# Patient Record
Sex: Male | Born: 1980 | Race: Black or African American | Hispanic: No | Marital: Married | Smoking: Current some day smoker
Health system: Southern US, Community
[De-identification: ages and names within clinical notes are randomized; demographics above are authoritative.]

## PROBLEM LIST (undated history)

## (undated) ENCOUNTER — Emergency Department (HOSPITAL_COMMUNITY): Discharge status: Against Medical Advice

## (undated) DIAGNOSIS — T7840XA Allergy, unspecified, initial encounter: Secondary | ICD-10-CM

## (undated) DIAGNOSIS — R51 Headache: Secondary | ICD-10-CM

## (undated) DIAGNOSIS — R519 Headache, unspecified: Secondary | ICD-10-CM

## (undated) HISTORY — DX: Headache: R51

## (undated) HISTORY — PX: WISDOM TOOTH EXTRACTION: SHX21

## (undated) HISTORY — DX: Allergy, unspecified, initial encounter: T78.40XA

## (undated) HISTORY — PX: ROTATOR CUFF REPAIR: SHX139

## (undated) HISTORY — DX: Headache, unspecified: R51.9

---

## 2011-06-16 ENCOUNTER — Ambulatory Visit

## 2012-06-14 ENCOUNTER — Ambulatory Visit (INDEPENDENT_AMBULATORY_CARE_PROVIDER_SITE_OTHER): Admitting: Emergency Medicine

## 2012-06-14 ENCOUNTER — Encounter: Payer: Self-pay | Admitting: Emergency Medicine

## 2012-06-14 VITALS — BP 124/76 | HR 72 | Temp 98.6°F | Resp 17 | Ht 75.5 in | Wt 235.0 lb

## 2012-06-14 DIAGNOSIS — J018 Other acute sinusitis: Secondary | ICD-10-CM

## 2012-06-14 MED ORDER — AMOXICILLIN-POT CLAVULANATE 875-125 MG PO TABS: 1.0000 | ORAL_TABLET | Freq: Two times a day (BID) | ORAL | Status: DC

## 2012-06-14 MED ORDER — PSEUDOEPHEDRINE-GUAIFENESIN ER 60-600 MG PO TB12: 1.0000 | ORAL_TABLET | Freq: Two times a day (BID) | ORAL | Status: DC

## 2012-06-14 NOTE — Progress Notes (Signed)
[facility] [address] [place] [address] [phone]  Date:  06/14/2012   Name:  Susie Pousson   DOB:  07/22/1980   MRN:  [id number]  PCP:  No PCP Per Patient    Chief Complaint: Chills, Epistaxis and Nasal Congestion   History of Present Illness:  Jc Veron is a 32 y.o. very pleasant male patient who presents with the following:  Ill with nasal congestion and post nasal drainage and green nasal drip.  Has a sore throat some fever earlier in the week with chills. Non productive cough.  No wheezing or shortness of breath.  No nausea or vomiting.  No improvement with over the counter medications or other home remedies. Denies other complaint or health concern today.   There are no active problems to display for this patient.   Past Medical History  Diagnosis Date  . Allergy     History reviewed. No pertinent past surgical history.  History  Substance Use Topics  . Smoking status: Never Smoker   . Smokeless tobacco: Not on file  . Alcohol Use: No    History reviewed. No pertinent family history.  No Known Allergies  Medication list has been reviewed and updated.  No current outpatient prescriptions on file prior to visit.   No current facility-administered medications on file prior to visit.    Review of Systems:  As per HPI, otherwise negative.    Physical Examination: Filed Vitals:   06/14/12 1017  BP: 124/76  Pulse: 72  Temp: 98.6 F (37 C)  Resp: 17   Filed Vitals:   06/14/12 1017  Height: 6' 3.5" (1.918 m)  Weight: 235 lb (106.595 kg)   Body mass index is 28.98 kg/(m^2). Ideal Body Weight: Weight in (lb) to have BMI = 25: 202.3  GEN: WDWN, NAD, Non-toxic, A & O x 3 HEENT: Atraumatic, Normocephalic. Neck supple. No masses, No LAD. Ears and Nose: No external deformity. CV: RRR, No M/G/R. No JVD. No thrill. No extra heart sounds. PULM: CTA B, no wheezes, crackles, rhonchi. No retractions. No resp. distress. No  accessory muscle use. ABD: S, NT, ND, +BS. No rebound. No HSM. EXTR: No c/c/e NEURO Normal gait.  PSYCH: Normally interactive. Conversant. Not depressed or anxious appearing.  Calm demeanor.    Assessment and Plan: Sinusitis augmentin mucinex   Signed,  Phillips Odor, MD

## 2012-06-14 NOTE — Patient Instructions (Addendum)
Allergic Rhinitis Allergic rhinitis is when the mucous membranes in the nose respond to allergens. Allergens are particles in the air that cause your body to have an allergic reaction. This causes you to release allergic antibodies. Through a chain of events, these eventually cause you to release histamine into the blood stream (hence the use of antihistamines). Although meant to be protective to the body, it is this release that causes your discomfort, such as frequent sneezing, congestion and an itchy runny nose.  CAUSES  The pollen allergens may come from grasses, trees, and weeds. This is seasonal allergic rhinitis, or "hay fever." Other allergens cause year-round allergic rhinitis (perennial allergic rhinitis) such as house dust mite allergen, pet dander and mold spores.  SYMPTOMS   Nasal stuffiness (congestion).  Runny, itchy nose with sneezing and tearing of the eyes.  There is often an itching of the mouth, eyes and ears. It cannot be cured, but it can be controlled with medications. DIAGNOSIS  If you are unable to determine the offending allergen, skin or blood testing may find it. TREATMENT   Avoid the allergen.  Medications and allergy shots (immunotherapy) can help.  Hay fever may often be treated with antihistamines in pill or nasal spray forms. Antihistamines block the effects of histamine. There are over-the-counter medicines that may help with nasal congestion and swelling around the eyes. Check with your caregiver before taking or giving this medicine. If the treatment above does not work, there are many new medications your caregiver can prescribe. Stronger medications may be used if initial measures are ineffective. Desensitizing injections can be used if medications and avoidance fails. Desensitization is when a patient is given ongoing shots until the body becomes less sensitive to the allergen. Make sure you follow up with your caregiver if problems continue. SEEK MEDICAL  CARE IF:   You develop fever (more than 100.5 F (38.1 C).  You develop a cough that does not stop easily (persistent).  You have shortness of breath.  You start wheezing.  Symptoms interfere with normal daily activities. Document Released: 10/10/2000 Document Revised: 04/09/2011 Document Reviewed: 04/21/2008 Exit[employer] Patient Information 2013 [employer]. Sinusitis Sinusitis is redness, soreness, and swelling (inflammation) of the paranasal sinuses. Paranasal sinuses are air pockets within the bones of your face (beneath the eyes, the middle of the forehead, or above the eyes). In healthy paranasal sinuses, mucus is able to drain out, and air is able to circulate through them by way of your nose. However, when your paranasal sinuses are inflamed, mucus and air can become trapped. This can allow bacteria and other germs to grow and cause infection. Sinusitis can develop quickly and last only a short time (acute) or continue over a long period (chronic). Sinusitis that lasts for more than 12 weeks is considered chronic.  CAUSES  Causes of sinusitis include:  Allergies.  Structural abnormalities, such as displacement of the cartilage that separates your nostrils (deviated septum), which can decrease the air flow through your nose and sinuses and affect sinus drainage.  Functional abnormalities, such as when the small hairs (cilia) that line your sinuses and help remove mucus do not work properly or are not present. SYMPTOMS  Symptoms of acute and chronic sinusitis are the same. The primary symptoms are pain and pressure around the affected sinuses. Other symptoms include:  Upper toothache.  Earache.  Headache.  Bad breath.  Decreased sense of smell and taste.  A cough, which worsens when you are lying flat.  Fatigue.    Fever.  Thick drainage from your nose, which often is green and may contain pus (purulent).  Swelling and warmth over the affected sinuses. DIAGNOSIS    Your caregiver will perform a physical exam. During the exam, your caregiver may:  Look in your nose for signs of abnormal growths in your nostrils (nasal polyps).  Tap over the affected sinus to check for signs of infection.  View the inside of your sinuses (endoscopy) with a special imaging device with a light attached (endoscope), which is inserted into your sinuses. If your caregiver suspects that you have chronic sinusitis, one or more of the following tests may be recommended:  Allergy tests.  Nasal culture A sample of mucus is taken from your nose and sent to a lab and screened for bacteria.  Nasal cytology A sample of mucus is taken from your nose and examined by your caregiver to determine if your sinusitis is related to an allergy. TREATMENT  Most cases of acute sinusitis are related to a viral infection and will resolve on their own within 10 days. Sometimes medicines are prescribed to help relieve symptoms (pain medicine, decongestants, nasal steroid sprays, or saline sprays).  However, for sinusitis related to a bacterial infection, your caregiver will prescribe antibiotic medicines. These are medicines that will help kill the bacteria causing the infection.  Rarely, sinusitis is caused by a fungal infection. In theses cases, your caregiver will prescribe antifungal medicine. For some cases of chronic sinusitis, surgery is needed. Generally, these are cases in which sinusitis recurs more than 3 times per year, despite other treatments. HOME CARE INSTRUCTIONS   Drink plenty of water. Water helps thin the mucus so your sinuses can drain more easily.  Use a humidifier.  Inhale steam 3 to 4 times a day (for example, sit in the bathroom with the shower running).  Apply a warm, moist washcloth to your face 3 to 4 times a day, or as directed by your caregiver.  Use saline nasal sprays to help moisten and clean your sinuses.  Take over-the-counter or prescription medicines for  pain, discomfort, or fever only as directed by your caregiver. SEEK IMMEDIATE MEDICAL CARE IF:  You have increasing pain or severe headaches.  You have nausea, vomiting, or drowsiness.  You have swelling around your face.  You have vision problems.  You have a stiff neck.  You have difficulty breathing. MAKE SURE YOU:   Understand these instructions.  Will watch your condition.  Will get help right away if you are not doing well or get worse. Document Released: 01/15/2005 Document Revised: 04/09/2011 Document Reviewed: 01/30/2011 Exit[employer] Patient Information 2013 [employer].  

## 2012-11-24 ENCOUNTER — Ambulatory Visit (INDEPENDENT_AMBULATORY_CARE_PROVIDER_SITE_OTHER): Admitting: Emergency Medicine

## 2012-11-24 VITALS — BP 118/70 | HR 69 | Temp 98.2°F | Resp 18 | Ht 74.5 in | Wt 229.0 lb

## 2012-11-24 DIAGNOSIS — N4889 Other specified disorders of penis: Secondary | ICD-10-CM

## 2012-11-24 DIAGNOSIS — R350 Frequency of micturition: Secondary | ICD-10-CM

## 2012-11-24 DIAGNOSIS — Z0000 Encounter for general adult medical examination without abnormal findings: Secondary | ICD-10-CM

## 2012-11-24 DIAGNOSIS — N489 Disorder of penis, unspecified: Secondary | ICD-10-CM

## 2012-11-24 LAB — POCT CBC
Granulocyte percent: 63.7 %G (ref 37–80)
HCT, POC: 45.2 % (ref 43.5–53.7)
Hemoglobin: 14.3 g/dL (ref 14.1–18.1)
Lymph, poc: 1.8 (ref 0.6–3.4)
MCH, POC: 30.3 pg (ref 27–31.2)
MCHC: 31.6 g/dL — AB (ref 31.8–35.4)
MCV: 95.8 fL (ref 80–97)
MID (cbc): 0.3 (ref 0–0.9)
MPV: 9.8 fL (ref 0–99.8)
POC Granulocyte: 3.6 (ref 2–6.9)
POC LYMPH PERCENT: 30.9 %L (ref 10–50)
POC MID %: 5.4 %M (ref 0–12)
Platelet Count, POC: 264 10*3/uL (ref 142–424)
RBC: 4.72 M/uL (ref 4.69–6.13)
RDW, POC: 13.7 %
WBC: 5.7 10*3/uL (ref 4.6–10.2)

## 2012-11-24 LAB — POCT URINALYSIS DIPSTICK
Bilirubin, UA: NEGATIVE
Glucose, UA: NEGATIVE
Ketones, UA: NEGATIVE
Leukocytes, UA: NEGATIVE
Nitrite, UA: NEGATIVE
Protein, UA: NEGATIVE
Spec Grav, UA: 1.025
Urobilinogen, UA: 1
pH, UA: 7

## 2012-11-24 LAB — GLUCOSE, POCT (MANUAL RESULT ENTRY): POC Glucose: 80 mg/dl (ref 70–99)

## 2012-11-24 MED ORDER — DOXYCYCLINE HYCLATE 100 MG PO CAPS: 100.0000 mg | ORAL_CAPSULE | Freq: Two times a day (BID) | ORAL | Status: DC

## 2012-11-24 NOTE — Progress Notes (Signed)
@[email or web address] @  Patient ID: Antonio Coleman MRN: [id number], DOB: [birth year] 32 y.o. Date of Encounter: 11/24/2012, 6:49 PM  Primary Physician: No PCP Per Patient  Chief Complaint: Physical (CPE)  32 y.o. y/o male male with history noted below here for CPE.  Doing well. No issues/complaints.  Review of Systems:  Consitutional: No fever, chills, fatigue, night sweats, lymphadenopathy, or weight changes. Eyes: No visual changes, eye redness, or discharge. ENT/Mouth: Ears: No otalgia, tinnitus, hearing loss, discharge. Nose: No congestion, rhinorrhea, sinus pain, or epistaxis. Throat: No sore throat, post nasal drip, or teeth pain. Cardiovascular: No CP, palpitations, diaphoresis, DOE, edema, orthopnea, PND. Respiratory: No cough, hemoptysis, SOB, or wheezing. Gastrointestinal: No anorexia, dysphagia, reflux, pain, nausea, vomiting, hematemesis, diarrhea, constipation, BRBPR, or melena. Genitourinary: Patient has urinary frequency but no burning or stinging he was extracted 2 weeks ago without correction and is recently developed a swollen tender raised area on the right side of his penis  Musculoskeletal: No decreased ROM, myalgias, stiffness, joint swelling, or weakness. Skin: No rash, erythema, lesion changes, pain, warmth, jaundice, or pruritis. Neurological: No headache, dizziness, syncope, seizures, tremors, memory loss, coordination problems, or paresthesias. Psychological: No anxiety, depression, hallucinations, SI/HI. Endocrine: No fatigue, polydipsia, polyphagia, polyuria, or known diabetes. All other systems were reviewed and are otherwise negative.  Past Medical History  Diagnosis Date  . Allergy      History reviewed. No pertinent past surgical history.  Home Meds:  Prior to Admission medications   Medication Sig Start Date End Date Taking? Authorizing Provider  fluticasone (FLONASE) 50 MCG/ACT nasal spray Place 2 sprays into the nose daily.   Yes Historical Provider, MD   Olopatadine HCl (PATANASE) 0.6 % SOLN Apply to eye.   Yes Historical Provider, MD  amoxicillin-clavulanate (AUGMENTIN) 875-125 MG per tablet Take 1 tablet by mouth 2 (two) times daily. 06/14/12   Phillips Odor, MD  pseudoephedrine-guaifenesin [facility] D) 60-600 MG per tablet Take 1 tablet by mouth every 12 (twelve) hours. 06/14/12 06/14/13  Phillips Odor, MD    Allergies: No Known Allergies  History   Social History  . Marital Status: Married    Spouse Name: N/A    Number of Children: N/A  . Years of Education: N/A   Occupational History  . Not on file.   Social History Main Topics  . Smoking status: Never Smoker   . Smokeless tobacco: Not on file  . Alcohol Use: No  . Drug Use: No  . Sexual Activity: Yes    Birth Control/ Protection: None   Other Topics Concern  . Not on file   Social History Narrative  . No narrative on file    History reviewed. No pertinent family history.  Physical Exam: Blood pressure 118/70, pulse 69, temperature 98.2 F (36.8 C), temperature source Oral, resp. rate 18, height 6' 2.5" (1.892 m), weight 229 lb (103.874 kg), SpO2 100.00%.  General: Well developed, well nourished, in no acute distress. HEENT: Normocephalic, atraumatic. Conjunctiva pink, sclera non-icteric. Pupils 2 mm constricting to 1 mm, round, regular, and equally reactive to light and accomodation. EOMI. Internal auditory canal clear. TMs with good cone of light and without pathology. Nasal mucosa pink. Nares are without discharge. No sinus tenderness. Oral mucosa pink. Dentition. Pharynx without exudate.   Neck: Supple. Trachea midline. No thyromegaly. Full ROM. No lymphadenopathy. Lungs: Clear to auscultation bilaterally without wheezes, rales, or rhonchi. Breathing is of normal effort and unlabored. Cardiovascular: RRR with S1 S2. No murmurs, rubs, or gallops appreciated. Distal pulses 2+  symmetrically. No carotid or abdominal bruits. Abdomen: Soft, non-tender, non-distended  with normoactive bowel sounds. No hepatosplenomegaly or masses. No rebound/guarding. No CVA tenderness. Without hernias.  Rectal: No external hemorrhoids or fissures. Rectal vault without masses.  Genitourinary:   circumcised male. No penile lesions. Testes descended bilaterally, and smooth without tenderness or masses. There is a 1 x 0.5 cm raised cystic type area which is slightly red mid penile shaft on the right .  Musculoskeletal: Full range of motion and 5/5 strength throughout. Without swelling, atrophy, tenderness, crepitus, or warmth. Extremities without clubbing, cyanosis, or edema. Calves supple. Skin: Warm and moist without erythema, ecchymosis, wounds, or rash. Neuro: A+Ox3. CN II-XII grossly intact. Moves all extremities spontaneously. Full sensation throughout. Normal gait. DTR 2+ throughout upper and lower extremities. Finger to nose intact. Psych:  Responds to questions appropriately with a normal affect.   Results for orders placed in visit on 11/24/12  POCT CBC      Result Value Range   WBC 5.7  4.6 - 10.2 K/uL   Lymph, poc 1.8  0.6 - 3.4   POC LYMPH PERCENT 30.9  10 - 50 %L   MID (cbc) 0.3  0 - 0.9   POC MID % 5.4  0 - 12 %M   POC Granulocyte 3.6  2 - 6.9   Granulocyte percent 63.7  37 - 80 %G   RBC 4.72  4.69 - 6.13 M/uL   Hemoglobin 14.3  14.1 - 18.1 g/dL   HCT, POC 16.1  09.6 - 53.7 %   MCV 95.8  80 - 97 fL   MCH, POC 30.3  27 - 31.2 pg   MCHC 31.6 (*) 31.8 - 35.4 g/dL   RDW, POC 04.5     Platelet Count, POC 264  142 - 424 K/uL   MPV 9.8  0 - 99.8 fL  GLUCOSE, POCT (MANUAL RESULT ENTRY)      Result Value Range   POC Glucose 80  70 - 99 mg/dl   Studies:    Assessment/Plan:  32 y.o. y/o male with normal physical exam. He's had urinary frequency. He does have a cystic like area on his shaft of the penis. We'll place on doxycycline. All routine STD screening was done and pending  -  Signed, Earl Lites, MD 11/24/2012 6:49 PM

## 2012-11-25 LAB — HIV ANTIBODY (ROUTINE TESTING W REFLEX): HIV: NONREACTIVE

## 2012-11-25 LAB — HEPATITIS C ANTIBODY: HCV Ab: NEGATIVE

## 2012-11-25 LAB — RPR

## 2012-11-26 ENCOUNTER — Encounter: Payer: Self-pay | Admitting: Family Medicine

## 2012-11-26 LAB — GC/CHLAMYDIA PROBE AMP
CT Probe RNA: NEGATIVE
GC Probe RNA: NEGATIVE

## 2012-11-29 ENCOUNTER — Telehealth: Payer: Self-pay | Admitting: *Deleted

## 2012-11-29 NOTE — Telephone Encounter (Signed)
Pt notified of lab results from 11/26/12

## 2013-01-13 ENCOUNTER — Ambulatory Visit (INDEPENDENT_AMBULATORY_CARE_PROVIDER_SITE_OTHER): Admitting: Family Medicine

## 2013-01-13 VITALS — BP 110/72 | HR 74 | Temp 98.2°F | Resp 18 | Ht 74.5 in | Wt 235.0 lb

## 2013-01-13 DIAGNOSIS — N489 Disorder of penis, unspecified: Secondary | ICD-10-CM

## 2013-01-13 DIAGNOSIS — R350 Frequency of micturition: Secondary | ICD-10-CM

## 2013-01-13 DIAGNOSIS — N4889 Other specified disorders of penis: Secondary | ICD-10-CM

## 2013-01-13 LAB — POCT UA - MICROSCOPIC ONLY
Casts, Ur, LPF, POC: NEGATIVE
Crystals, Ur, HPF, POC: NEGATIVE
Epithelial cells, urine per micros: NEGATIVE
Mucus, UA: POSITIVE
Yeast, UA: NEGATIVE

## 2013-01-13 LAB — BASIC METABOLIC PANEL
BUN: 13 mg/dL (ref 6–23)
CO2: 26 mEq/L (ref 19–32)
Calcium: 9.3 mg/dL (ref 8.4–10.5)
Chloride: 104 mEq/L (ref 96–112)
Creat: 1.07 mg/dL (ref 0.50–1.35)
Glucose, Bld: 85 mg/dL (ref 70–99)
Potassium: 4.4 mEq/L (ref 3.5–5.3)
Sodium: 138 mEq/L (ref 135–145)

## 2013-01-13 LAB — POCT URINALYSIS DIPSTICK
Bilirubin, UA: NEGATIVE
Blood, UA: NEGATIVE
Glucose, UA: NEGATIVE
Leukocytes, UA: NEGATIVE
Nitrite, UA: NEGATIVE
Protein, UA: NEGATIVE
Spec Grav, UA: 1.015
Urobilinogen, UA: 1
pH, UA: 8

## 2013-01-13 LAB — GLUCOSE, POCT (MANUAL RESULT ENTRY): POC Glucose: 86 mg/dl (ref 70–99)

## 2013-01-13 MED ORDER — DOXYCYCLINE HYCLATE 100 MG PO CAPS: 100.0000 mg | ORAL_CAPSULE | Freq: Two times a day (BID) | ORAL | Status: DC

## 2013-01-13 NOTE — Patient Instructions (Signed)
Keep appointment with urologist. You should receive a call or letter about your lab results within the next week to 10 days.  The area on the side of the penis may be a benign cyst, but this can be discussed with urologist. Can restart antibiotic as it helped before. Return to the clinic or go to the nearest emergency room if any of your symptoms worsen or new symptoms occur.

## 2013-01-13 NOTE — Progress Notes (Signed)
Subjective:  This chart was scribed for Antonio Staggers, MD by Ronal Fear, ED Scribe. This patient was seen in room 14 and the patient's care was started at 5:07 PM.   Patient ID: Antonio Coleman, male    DOB: 1980-03-31, 32 y.o.   MRN: [id number]  HPI Antonio Coleman is a 32 y.o. male No PCP Per Patient  Pt seen 11/24/12 by Dr. Cleta Alberts for physical. Noted urinary frequency. Treated with doxycycline for a cystic like area on his penis. STI testing done, including negative chlamydia and gonorrhea. Non reactive HIV. Negative HCV antibody.  Non reactive RPR.   He presents to [facility] c/o urinary frequency for about 1x year. He wants to have blood work done to rule out anything just to be cautious. Pt denies penile discharge, new sexual partner, unprotected sex, hx of sexually transmitted infections.  Pt has been married for 5 years.  He denies unexplained weight loss, back pain, fever, diaphoresis, or burning with urine. Pt has discomfort in his lower abdomen while urinating.  Pt will be seeing a urologist for the first time at [facility] and will be seen on Monday. Pt had UA on oct 27 th visit, with trace blood, negative nitrite, negative LE, negative protein, negative glucose. The pt's cyst from his previous exam did go away with antibiotic but it may be back in the last couple days.    Chief Complaint  Patient presents with  . Urinary Frequency    over a year -was told to have kidneys tested   . hiv testing    There are no active problems to display for this patient.  Past Medical History  Diagnosis Date  . Allergy    History reviewed. No pertinent past surgical history. No Known Allergies Prior to Admission medications   Medication Sig Start Date End Date Taking? Authorizing Provider  fluticasone (FLONASE) 50 MCG/ACT nasal spray Place 2 sprays into the nose daily.   Yes Historical Provider, MD  Olopatadine HCl (PATANASE) 0.6 % SOLN Apply to eye.   Yes Historical Provider, MD    amoxicillin-clavulanate (AUGMENTIN) 875-125 MG per tablet Take 1 tablet by mouth 2 (two) times daily. 06/14/12   Phillips Odor, MD  doxycycline (VIBRAMYCIN) 100 MG capsule Take 1 capsule (100 mg total) by mouth 2 (two) times daily. 11/24/12   Collene Gobble, MD  pseudoephedrine-guaifenesin [facility] D) 60-600 MG per tablet Take 1 tablet by mouth every 12 (twelve) hours. 06/14/12 06/14/13  Phillips Odor, MD   History   Social History  . Marital Status: Married    Spouse Name: N/A    Number of Children: N/A  . Years of Education: N/A   Occupational History  . Not on file.   Social History Main Topics  . Smoking status: Never Smoker   . Smokeless tobacco: Not on file  . Alcohol Use: No  . Drug Use: No  . Sexual Activity: Yes    Birth Control/ Protection: None   Other Topics Concern  . Not on file   Social History Narrative  . No narrative on file      Review of Systems  Constitutional: Negative for fever, chills, diaphoresis and unexpected weight change.  Respiratory: Negative for cough.   Gastrointestinal: Positive for abdominal pain. Negative for nausea, vomiting and blood in stool.  Genitourinary: Positive for frequency. Negative for dysuria, hematuria, decreased urine volume, discharge, penile swelling and penile pain.  Musculoskeletal: Negative for back pain.       Objective:  Physical Exam  Nursing note and vitals reviewed. Constitutional: He is oriented to person, place, and time. He appears well-developed and well-nourished. No distress.  HENT:  Head: Normocephalic and atraumatic.  Eyes: EOM are normal.  Neck: Neck supple. No tracheal deviation present.  Cardiovascular: Normal rate.   Pulmonary/Chest: Effort normal. No respiratory distress.  Abdominal: Soft. There is no tenderness. There is no CVA tenderness. No hernia. Hernia confirmed negative in the right inguinal area and confirmed negative in the left inguinal area.  Genitourinary: Right testis shows  no swelling and no tenderness. Left testis shows no swelling and no tenderness. No penile tenderness. No discharge found.  1 cm firm area on the right side of penis. Testicles non tender, non tender epididimus No inguinal hernia, no lymphadenopathy in inguinal area No penile discharge with palpation  Musculoskeletal: Normal range of motion.  Lymphadenopathy:       Right: No inguinal adenopathy present.       Left: No inguinal adenopathy present.  Neurological: He is alert and oriented to person, place, and time.  Skin: Skin is warm and dry.  Psychiatric: He has a normal mood and affect. His behavior is normal.    Filed Vitals:   01/13/13 1555  BP: 110/72  Pulse: 74  Temp: 98.2 F (36.8 C)  TempSrc: Oral  Resp: 18  Height: 6' 2.5" (1.892 m)  Weight: 235 lb (106.595 kg)  SpO2: 100%   Results for orders placed in visit on 01/13/13  POCT URINALYSIS DIPSTICK      Result Value Range   Color, UA yellow     Clarity, UA clear     Glucose, UA neg     Bilirubin, UA neg     Ketones, UA trace     Spec Grav, UA 1.015     Blood, UA neg     pH, UA 8.0     Protein, UA neg     Urobilinogen, UA 1.0     Nitrite, UA neg     Leukocytes, UA Negative    POCT UA - MICROSCOPIC ONLY      Result Value Range   WBC, Ur, HPF, POC 0-1     RBC, urine, microscopic 0-4     Bacteria, U Microscopic trace     Mucus, UA pos     Epithelial cells, urine per micros neg     Crystals, Ur, HPF, POC neg     Casts, Ur, LPF, POC neg     Yeast, UA neg    GLUCOSE, POCT (MANUAL RESULT ENTRY)      Result Value Range   POC Glucose 86  70 - 99 mg/dl      Assessment & Plan:   Antonio Coleman is a 32 y.o. male Frequent urination - Plan: POCT urinalysis dipstick, POCT UA - Microscopic Only, doxycycline (VIBRAMYCIN) 100 MG capsule, POCT glucose (manual entry), Urine culture, HIV Antibody, Basic metabolic panel, PSA  Penile lesion - Plan: doxycycline (VIBRAMYCIN) 100 MG capsule, HIV Antibody  Penile cyst - Plan:  doxycycline (VIBRAMYCIN) 100 MG capsule  1 year hx urinary freq. Check PSA, urine cx, BMP.  Discussed lower risk for HIV without new partners and recent negative test, but requests this again. Keep follow up with urology.   Possible cyst on R side of penis and can have this evaluated by urology, but as improved with doxy prior - can repeat dose for now. rtc precautions.   Meds ordered this encounter  Medications  . doxycycline (  VIBRAMYCIN) 100 MG capsule    Sig: Take 1 capsule (100 mg total) by mouth 2 (two) times daily.    Dispense:  20 capsule    Refill:  0   Patient Instructions  Keep appointment with urologist. You should receive a call or letter about your lab results within the next week to 10 days.  The area on the side of the penis may be a benign cyst, but this can be discussed with urologist. Can restart antibiotic as it helped before. Return to the clinic or go to the nearest emergency room if any of your symptoms worsen or new symptoms occur.

## 2013-01-14 LAB — HIV ANTIBODY (ROUTINE TESTING W REFLEX): HIV: NONREACTIVE

## 2013-01-14 LAB — PSA: PSA: 0.62 ng/mL (ref <=4.00)

## 2013-01-15 LAB — URINE CULTURE
Colony Count: NO GROWTH
Organism ID, Bacteria: NO GROWTH

## 2013-01-19 ENCOUNTER — Telehealth: Payer: Self-pay | Admitting: Family Medicine

## 2013-01-19 NOTE — Telephone Encounter (Signed)
Patient called to get lab results i advised his labs was within normal range

## 2013-02-13 ENCOUNTER — Encounter (HOSPITAL_COMMUNITY): Payer: Self-pay | Admitting: Emergency Medicine

## 2013-02-13 ENCOUNTER — Emergency Department (HOSPITAL_COMMUNITY)
Admission: EM | Admit: 2013-02-13 | Discharge: 2013-02-14 | Disposition: A | Discharge status: Home / Self Care | Attending: Emergency Medicine | Admitting: Emergency Medicine

## 2013-02-13 DIAGNOSIS — J111 Influenza due to unidentified influenza virus with other respiratory manifestations: Secondary | ICD-10-CM | POA: Insufficient documentation

## 2013-02-13 DIAGNOSIS — R5381 Other malaise: Secondary | ICD-10-CM | POA: Insufficient documentation

## 2013-02-13 DIAGNOSIS — B349 Viral infection, unspecified: Secondary | ICD-10-CM

## 2013-02-13 DIAGNOSIS — R5383 Other fatigue: Secondary | ICD-10-CM

## 2013-02-13 DIAGNOSIS — B9789 Other viral agents as the cause of diseases classified elsewhere: Secondary | ICD-10-CM | POA: Insufficient documentation

## 2013-02-13 DIAGNOSIS — Z79899 Other long term (current) drug therapy: Secondary | ICD-10-CM | POA: Insufficient documentation

## 2013-02-13 DIAGNOSIS — R6889 Other general symptoms and signs: Secondary | ICD-10-CM

## 2013-02-13 DIAGNOSIS — IMO0001 Reserved for inherently not codable concepts without codable children: Secondary | ICD-10-CM | POA: Insufficient documentation

## 2013-02-13 NOTE — ED Notes (Signed)
C/o cough, congestion (nasal and chest), fever, some dizziness & HA (denies nvd), denies HA at this time, denies other pain or sx. Onset 1500 today, "felt fine before this". Describes congestion as "a lot of thick green mucus".

## 2013-02-13 NOTE — ED Notes (Signed)
Sinus congestion for 8 hours  With drainage and a sl headache

## 2013-02-14 MED ORDER — IBUPROFEN 600 MG PO TABS: 600.0000 mg | ORAL_TABLET | Freq: Four times a day (QID) | ORAL | Status: DC | PRN

## 2013-02-14 MED ORDER — HYDROCODONE-HOMATROPINE 5-1.5 MG/5ML PO SYRP: 5.0000 mL | ORAL_SOLUTION | Freq: Four times a day (QID) | ORAL | Status: DC | PRN

## 2013-02-14 NOTE — Discharge Instructions (Signed)
Recommend your nasal spray, Mucinex D which can be found behind a counter your local pharmacy, and ibuprofen for symptoms. You may use Hycodan as prescribed for persistent cough and sore throat. Return to the emergency department if symptoms worsen such as if you develop a fever that does not respond to Tylenol or ibuprofen, neck pain or stiffness, shortness of breath, loss of consciousness, or uncontrolled vomiting.  Influenza, Adult Influenza ("the flu") is a viral infection of the respiratory tract. It occurs more often in winter months because people spend more time in close contact with one another. Influenza can make you feel very sick. Influenza easily spreads from person to person (contagious). CAUSES  Influenza is caused by a virus that infects the respiratory tract. You can catch the virus by breathing in droplets from an infected person's cough or sneeze. You can also catch the virus by touching something that was recently contaminated with the virus and then touching your mouth, nose, or eyes. SYMPTOMS  Symptoms typically last 4 to 10 days and may include:  Fever.  Chills.  Headache, body aches, and muscle aches.  Sore throat.  Chest discomfort and cough.  Poor appetite.  Weakness or feeling tired.  Dizziness.  Nausea or vomiting. DIAGNOSIS  Diagnosis of influenza is often made based on your history and a physical exam. A nose or throat swab test can be done to confirm the diagnosis. RISKS AND COMPLICATIONS You may be at risk for a more severe case of influenza if you smoke cigarettes, have diabetes, have chronic heart disease (such as heart failure) or lung disease (such as asthma), or if you have a weakened immune system. Elderly people and pregnant women are also at risk for more serious infections. The most common complication of influenza is a lung infection (pneumonia). Sometimes, this complication can require emergency medical care and may be  life-threatening. PREVENTION  An annual influenza vaccination (flu shot) is the best way to avoid getting influenza. An annual flu shot is now routinely recommended for all adults in the [place] TREATMENT  In mild cases, influenza goes away on its own. Treatment is directed at relieving symptoms. For more severe cases, your caregiver may prescribe antiviral medicines to shorten the sickness. Antibiotic medicines are not effective, because the infection is caused by a virus, not by bacteria. HOME CARE INSTRUCTIONS  Only take over-the-counter or prescription medicines for pain, discomfort, or fever as directed by your caregiver.  Use a cool mist humidifier to make breathing easier.  Get plenty of rest until your temperature returns to normal. This usually takes 3 to 4 days.  Drink enough fluids to keep your urine clear or pale yellow.  Cover your mouth and nose when coughing or sneezing, and wash your hands well to avoid spreading the virus.  Stay home from work or school until your fever has been gone for at least 1 full day. SEEK MEDICAL CARE IF:   You have chest pain or a deep cough that worsens or produces more mucus.  You have nausea, vomiting, or diarrhea. SEEK IMMEDIATE MEDICAL CARE IF:   You have difficulty breathing, shortness of breath, or your skin or nails turn bluish.  You have severe neck pain or stiffness.  You have a severe headache, facial pain, or earache.  You have a worsening or recurring fever.  You have nausea or vomiting that cannot be controlled. MAKE SURE YOU:  Understand these instructions.  Will watch your condition.  Will get help right away  if you are not doing well or get worse. Document Released: 01/13/2000 Document Revised: 07/17/2011 Document Reviewed: 04/16/2011 [facility] Patient Information 2014 [place], [place].

## 2013-02-14 NOTE — ED Provider Notes (Signed)
CSN: [id number]     Arrival date & time 02/13/13  2328 History   First MD Initiated Contact with Patient 02/14/13 0037     Chief Complaint  Patient presents with  . sinus congestion    (Consider location/radiation/quality/duration/timing/severity/associated sxs/prior Treatment) HPI Comments: Patient is a 33 year old male with no significant past medical history who presents to the emergency department today for sinus congestion x8 hours. Patient states the symptoms have been gradually worsening since onset without any modifying factors. He endorses associated rhinorrhea, sinus headaches, sinus pressure, and cough productive of green phlegm. He also states he had a low-grade temperature 75F earlier this evening. Patient denies associated inability to swallow or drooling, sore throat, neck pain or stiffness, shortness of breath, chest pain, abdominal pain or vomiting, numbness/tingling, and weakness. He states he did get his flu shot this year.  The history is provided by the patient. No language interpreter was used.    Past Medical History  Diagnosis Date  . Allergy    History reviewed. No pertinent past surgical history. No family history on file. History  Substance Use Topics  . Smoking status: Never Smoker   . Smokeless tobacco: Not on file  . Alcohol Use: No    Review of Systems  Constitutional: Positive for fatigue. Negative for fever.  HENT: Positive for congestion, postnasal drip, rhinorrhea and sinus pressure. Negative for ear discharge, sore throat and trouble swallowing.   Respiratory: Positive for cough. Negative for shortness of breath.   Cardiovascular: Negative for chest pain.  Gastrointestinal: Negative for nausea and vomiting.  Musculoskeletal: Positive for myalgias.  Neurological: Negative for weakness and numbness.  All other systems reviewed and are negative.    Allergies  Soy allergy  Home Medications   Current Outpatient Rx  Name  Route  Sig  Dispense   Refill  . acetaminophen (TYLENOL) 325 MG tablet   Oral   Take 325 mg by mouth every 6 (six) hours as needed for fever or headache.         . guaiFENesin (MUCINEX) 600 MG 12 hr tablet   Oral   Take 600 mg by mouth daily as needed for to loosen phlegm.         . Olopatadine HCl (PATANASE) 0.6 % SOLN   Both Eyes   Place 2 drops into both eyes 2 (two) times daily.          Marland Kitchen HYDROcodone-homatropine (HYCODAN) 5-1.5 MG/5ML syrup   Oral   Take 5 mLs by mouth every 6 (six) hours as needed for cough.   120 mL   0   . ibuprofen (ADVIL,MOTRIN) 600 MG tablet   Oral   Take 1 tablet (600 mg total) by mouth every 6 (six) hours as needed.   30 tablet   0    BP 142/81  Pulse 65  Temp(Src) 97.5 F (36.4 C) (Oral)  Resp 16  Wt 232 lb 8 oz (105.461 kg)  SpO2 100%  Physical Exam  Nursing note and vitals reviewed. Constitutional: He is oriented to person, place, and time. He appears well-developed and well-nourished. No distress.  HENT:  Head: Normocephalic and atraumatic.  Right Ear: Tympanic membrane, external ear and ear canal normal. No mastoid tenderness.  Left Ear: Tympanic membrane, external ear and ear canal normal. No mastoid tenderness.  Nose: Nose normal. Right sinus exhibits no maxillary sinus tenderness and no frontal sinus tenderness. Left sinus exhibits no maxillary sinus tenderness and no frontal sinus tenderness.  Mouth/Throat: Uvula  is midline, oropharynx is clear and moist and mucous membranes are normal.  Eyes: Conjunctivae and EOM are normal. No scleral icterus.  Neck: Normal range of motion. Neck supple.  Cardiovascular: Normal rate, regular rhythm and normal heart sounds.   Pulmonary/Chest: Effort normal and breath sounds normal. No stridor. No respiratory distress. He has no wheezes. He has no rales. He exhibits no tenderness.  No wheezes or rales. Symmetric chest expansion. No retractions or accessory muscle use.  Abdominal: Soft. He exhibits no distension.  There is no tenderness.  Musculoskeletal: Normal range of motion.  Lymphadenopathy:    He has no cervical adenopathy.  Neurological: He is alert and oriented to person, place, and time. He exhibits normal muscle tone.  GCS 15. Patient moves extremities without ataxia.  Skin: Skin is warm and dry. No rash noted. He is not diaphoretic. No erythema. No pallor.  Psychiatric: He has a normal mood and affect. His behavior is normal.    ED Course  Procedures (including critical care time) Labs Review Labs Reviewed - No data to display Imaging Review No results found.  EKG Interpretation   None       MDM   1. Flu-like symptoms   2. Viral illness    Uncomplicated viral illness. Patient well and nontoxic appearing, hemodynamically stable, and afebrile. No nuchal rigidity or meningismus. Uvula midline and patient tolerating secretions without difficulty. Patient without tachypnea, dyspnea, or hypoxia. Lungs clear to auscultation bilaterally; doubt pneumonia. Abdomen soft, nontender. Symptoms consistent with uncomplicated viral illness; likely influenza. I discussed the risks and benefits of Tamiflu with the patient, who has opted to decline from treatment with Tamiflu today. Will treat supportively with nasal spray, Mucinex D, Advil, and Hycodan as needed. Return precautions discussed with the patient who verbalizes comfort and understanding with this discharge plan with no unaddressed concerns.    Antony MaduraKelly Layaan Mott, PA-C 02/14/13 [phone]

## 2013-02-15 NOTE — ED Provider Notes (Signed)
Medical screening examination/treatment/procedure(s) were performed by non-physician practitioner and as supervising physician I was immediately available for consultation/collaboration.  EKG Interpretation   None         Michala Deblanc, MD 02/15/13 0210 

## 2013-04-14 ENCOUNTER — Other Ambulatory Visit: Payer: Self-pay | Admitting: Family Medicine

## 2013-08-09 ENCOUNTER — Emergency Department (HOSPITAL_COMMUNITY)

## 2013-08-09 ENCOUNTER — Encounter (HOSPITAL_COMMUNITY): Payer: Self-pay | Admitting: Emergency Medicine

## 2013-08-09 ENCOUNTER — Emergency Department (HOSPITAL_COMMUNITY)
Admission: EM | Admit: 2013-08-09 | Discharge: 2013-08-09 | Disposition: A | Discharge status: Home / Self Care | Attending: Emergency Medicine | Admitting: Emergency Medicine

## 2013-08-09 DIAGNOSIS — S0990XA Unspecified injury of head, initial encounter: Secondary | ICD-10-CM | POA: Diagnosis present

## 2013-08-09 DIAGNOSIS — Y9389 Activity, other specified: Secondary | ICD-10-CM | POA: Diagnosis not present

## 2013-08-09 DIAGNOSIS — S199XXA Unspecified injury of neck, initial encounter: Secondary | ICD-10-CM | POA: Diagnosis not present

## 2013-08-09 DIAGNOSIS — S1093XA Contusion of unspecified part of neck, initial encounter: Principal | ICD-10-CM

## 2013-08-09 DIAGNOSIS — Z791 Long term (current) use of non-steroidal anti-inflammatories (NSAID): Secondary | ICD-10-CM | POA: Diagnosis not present

## 2013-08-09 DIAGNOSIS — S0003XA Contusion of scalp, initial encounter: Secondary | ICD-10-CM | POA: Insufficient documentation

## 2013-08-09 DIAGNOSIS — IMO0002 Reserved for concepts with insufficient information to code with codable children: Secondary | ICD-10-CM | POA: Insufficient documentation

## 2013-08-09 DIAGNOSIS — S0993XA Unspecified injury of face, initial encounter: Secondary | ICD-10-CM | POA: Diagnosis not present

## 2013-08-09 DIAGNOSIS — S0083XA Contusion of other part of head, initial encounter: Secondary | ICD-10-CM | POA: Insufficient documentation

## 2013-08-09 DIAGNOSIS — Y92410 Unspecified street and highway as the place of occurrence of the external cause: Secondary | ICD-10-CM | POA: Insufficient documentation

## 2013-08-09 DIAGNOSIS — Z79899 Other long term (current) drug therapy: Secondary | ICD-10-CM | POA: Diagnosis not present

## 2013-08-09 DIAGNOSIS — R6884 Jaw pain: Secondary | ICD-10-CM

## 2013-08-09 DIAGNOSIS — F17200 Nicotine dependence, unspecified, uncomplicated: Secondary | ICD-10-CM | POA: Insufficient documentation

## 2013-08-09 DIAGNOSIS — T148XXA Other injury of unspecified body region, initial encounter: Secondary | ICD-10-CM

## 2013-08-09 MED ORDER — BACITRACIN ZINC 500 UNIT/GM EX OINT
1.0000 "application " | TOPICAL_OINTMENT | Freq: Once | CUTANEOUS | Status: AC
  Administered 2013-08-09: 1 via TOPICAL
  Filled 2013-08-09: qty 0.9

## 2013-08-09 MED ORDER — HYDROCODONE-ACETAMINOPHEN 5-325 MG PO TABS: 1.0000 | ORAL_TABLET | Freq: Four times a day (QID) | ORAL | Status: DC | PRN
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
## 2013-08-09 NOTE — ED Notes (Signed)
Forehead cleansed with normal saline.  Bacitracin applied to the wound and covered with gauze dressing.
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
## 2013-08-09 NOTE — ED Notes (Signed)
Pt ambulated in the hall way with a steady gait.  Pt denies feeling light headed or dizzy.
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
## 2013-12-15 ENCOUNTER — Encounter (HOSPITAL_COMMUNITY): Payer: Self-pay | Admitting: Emergency Medicine

## 2013-12-15 ENCOUNTER — Emergency Department (HOSPITAL_COMMUNITY)

## 2013-12-15 ENCOUNTER — Emergency Department (HOSPITAL_COMMUNITY)
Admission: EM | Admit: 2013-12-15 | Discharge: 2013-12-15 | Disposition: A | Discharge status: Home / Self Care | Attending: Emergency Medicine | Admitting: Emergency Medicine

## 2013-12-15 DIAGNOSIS — R0602 Shortness of breath: Secondary | ICD-10-CM | POA: Diagnosis not present

## 2013-12-15 DIAGNOSIS — Z79899 Other long term (current) drug therapy: Secondary | ICD-10-CM | POA: Diagnosis not present

## 2013-12-15 DIAGNOSIS — R079 Chest pain, unspecified: Secondary | ICD-10-CM

## 2013-12-15 DIAGNOSIS — R0789 Other chest pain: Secondary | ICD-10-CM | POA: Diagnosis not present

## 2013-12-15 DIAGNOSIS — Z87891 Personal history of nicotine dependence: Secondary | ICD-10-CM | POA: Diagnosis not present

## 2013-12-15 LAB — BASIC METABOLIC PANEL
Anion gap: 10 (ref 5–15)
BUN: 12 mg/dL (ref 6–23)
CO2: 28 mEq/L (ref 19–32)
Calcium: 9.5 mg/dL (ref 8.4–10.5)
Chloride: 103 mEq/L (ref 96–112)
Creatinine, Ser: 1.03 mg/dL (ref 0.50–1.35)
GFR calc Af Amer: >90 mL/min (ref >90)
GFR calc non Af Amer: >90 mL/min (ref >90)
Glucose, Bld: 88 mg/dL (ref 70–99)
Potassium: 3.9 mEq/L (ref 3.7–5.3)
Sodium: 141 mEq/L (ref 137–147)

## 2013-12-15 LAB — I-STAT TROPONIN, ED
Troponin i, poc: 0 ng/mL (ref 0.00–0.08)
Troponin i, poc: 0.01 ng/mL (ref 0.00–0.08)

## 2013-12-15 LAB — CBC
HCT: 42.4 % (ref 39.0–52.0)
Hemoglobin: 14.3 g/dL (ref 13.0–17.0)
MCH: 30.8 pg (ref 26.0–34.0)
MCHC: 33.7 g/dL (ref 30.0–36.0)
MCV: 91.4 fL (ref 78.0–100.0)
Platelets: 239 10*3/uL (ref 150–400)
RBC: 4.64 MIL/uL (ref 4.22–5.81)
RDW: 12.8 % (ref 11.5–15.5)
WBC: 5.9 10*3/uL (ref 4.0–10.5)

## 2013-12-15 LAB — D-DIMER, QUANTITATIVE: D-Dimer, Quant: 0.82 ug/mL-FEU — ABNORMAL HIGH (ref 0.00–0.48)

## 2013-12-15 MED ORDER — KETOROLAC TROMETHAMINE 30 MG/ML IJ SOLN
30.0000 mg | Freq: Once | INTRAMUSCULAR | Status: AC
  Administered 2013-12-15: 30 mg via INTRAVENOUS
  Filled 2013-12-15: qty 1

## 2013-12-15 MED ORDER — IOHEXOL 350 MG/ML SOLN
100.0000 mL | Freq: Once | INTRAVENOUS | Status: AC | PRN
  Administered 2013-12-15: 100 mL via INTRAVENOUS

## 2013-12-15 MED ORDER — IBUPROFEN 600 MG PO TABS: 600.0000 mg | ORAL_TABLET | Freq: Four times a day (QID) | ORAL | Status: DC | PRN

## 2013-12-15 NOTE — ED Provider Notes (Signed)
CSN: [id number]     Arrival date & time 12/15/13  0353 History   First MD Initiated Contact with Patient 12/15/13 0435     Chief Complaint  Patient presents with  . Chest Pain     (Consider location/radiation/quality/duration/timing/severity/associated sxs/prior Treatment) HPI Patient presents with acute onset chest pain starting at 3 AM. States it began as a "bubble" in his upper abdomen and causing a tightness in his right chest followed by a sharp central chest pain. He's had no nausea or vomiting. Patient has had mild shortness of breath. Chest pain is improved at this time. He denies any recent travel or surgeries. No history of coronary artery disease. No family history of coronary artery or thromboembolic disease. No lower extremity swelling or pain. Past Medical History  Diagnosis Date  . Allergy    History reviewed. No pertinent past surgical history. History reviewed. No pertinent family history. History  Substance Use Topics  . Smoking status: Former Games developermoker  . Smokeless tobacco: Not on file  . Alcohol Use: Yes     Comment: occassional    Review of Systems  Constitutional: Negative for fever and chills.  Respiratory: Positive for shortness of breath. Negative for cough.   Cardiovascular: Positive for chest pain. Negative for palpitations and leg swelling.  Gastrointestinal: Negative for vomiting, abdominal pain, diarrhea, constipation and abdominal distention.  Genitourinary: Negative for dysuria.  Musculoskeletal: Negative for back pain, neck pain and neck stiffness.  Skin: Negative for rash and wound.  Neurological: Negative for dizziness, weakness, light-headedness, numbness and headaches.  All other systems reviewed and are negative.     Allergies  Soy allergy  Home Medications   Prior to Admission medications   Medication Sig Start Date End Date Taking? Authorizing Provider  Multiple Vitamin (MULTIVITAMIN WITH MINERALS) TABS tablet Take 1 tablet by mouth  daily.   Yes Historical Provider, MD  HYDROcodone-acetaminophen (NORCO/VICODIN) 5-325 MG per tablet Take 1-2 tablets by mouth every 6 (six) hours as needed. 08/09/13   Roxy Horsemanobert Browning, PA-C  ibuprofen (ADVIL,MOTRIN) 600 MG tablet Take 1 tablet (600 mg total) by mouth every 6 (six) hours as needed for moderate pain. 12/15/13   Loren Raceravid Nissa Stannard, MD   BP 140/87 mmHg  Pulse 62  Temp(Src) 98.4 F (36.9 C) (Oral)  Resp 20  Ht 6\' 3"  (1.905 m)  Wt 238 lb (107.956 kg)  BMI 29.75 kg/m2  SpO2 100% Physical Exam  Constitutional: He is oriented to person, place, and time. He appears well-developed and well-nourished. No distress.  HENT:  Head: Normocephalic and atraumatic.  Mouth/Throat: Oropharynx is clear and moist.  Eyes: EOM are normal. Pupils are equal, round, and reactive to light.  Neck: Normal range of motion. Neck supple.  Cardiovascular: Normal rate and regular rhythm.   Pulmonary/Chest: Effort normal and breath sounds normal. No respiratory distress. He has no wheezes. He has no rales. He exhibits no tenderness.  Abdominal: Soft. Bowel sounds are normal. He exhibits no distension and no mass. There is no tenderness. There is no rebound and no guarding.  Musculoskeletal: Normal range of motion. He exhibits no edema or tenderness.  No calf swelling or tenderness.  Neurological: He is alert and oriented to person, place, and time.  Results from this without deficit. Sensation is grossly intact.  Skin: Skin is warm and dry. No rash noted. No erythema.  Psychiatric: He has a normal mood and affect. His behavior is normal.  Nursing note and vitals reviewed.   ED Course  Procedures (  including critical care time) Labs Review Labs Reviewed  D-DIMER, QUANTITATIVE - Abnormal; Notable for the following:    D-Dimer, Quant 0.82 (*)    All other components within normal limits  CBC  BASIC METABOLIC PANEL  I-STAT TROPOININ, ED  I-STAT TROPOININ, ED    Imaging Review No results found.    EKG Interpretation   Date/Time:  Tuesday December 15 2013 04:05:36 EST Ventricular Rate:  59 PR Interval:  175 QRS Duration: 86 QT Interval:  405 QTC Calculation: 401 R Axis:   55 Text Interpretation:  Sinus rhythm ED PHYSICIAN INTERPRETATION AVAILABLE  IN [facility] Confirmed by TEST, Record ([id number]) on 12/17/2013 7:18:13  AM      Date: 12/15/2013  Rate: 59  Rhythm: normal sinus rhythm  QRS Axis: normal  Intervals: normal  ST/T Wave abnormalities: normal  Conduction Disutrbances:none  Narrative Interpretation:   Old EKG Reviewed: none available   MDM   Final diagnoses:  Chest pain     Troponin 2 normal. Normal EKG. Patient has heart score of 0. Would be very atypical for coronary artery disease. Mildly elevated d-dimer with CT angiogram that is normal. We'll treat with NSAIDs. Return precautions given.   Loren Raceravid Issaiah Seabrooks, MD 12/19/13 [phone]

## 2013-12-15 NOTE — Discharge Instructions (Signed)
Make an appointment to follow-up with cardiologist. Return for any concerns.    Chest Pain (Nonspecific) It is often hard to give a specific diagnosis for the cause of chest pain. There is always a chance that your pain could be related to something serious, such as a heart attack or a blood clot in the lungs. You need to follow up with your health care provider for further evaluation. CAUSES   Heartburn.  Pneumonia or bronchitis.  Anxiety or stress.  Inflammation around your heart (pericarditis) or lung (pleuritis or pleurisy).  A blood clot in the lung.  A collapsed lung (pneumothorax). It can develop suddenly on its own (spontaneous pneumothorax) or from trauma to the chest.  Shingles infection (herpes zoster virus). The chest wall is composed of bones, muscles, and cartilage. Any of these can be the source of the pain.  The bones can be bruised by injury.  The muscles or cartilage can be strained by coughing or overwork.  The cartilage can be affected by inflammation and become sore (costochondritis). DIAGNOSIS  Lab tests or other studies may be needed to find the cause of your pain. Your health care provider may have you take a test called an ambulatory electrocardiogram (ECG). An ECG records your heartbeat patterns over a 24-hour period. You may also have other tests, such as:  Transthoracic echocardiogram (TTE). During echocardiography, sound waves are used to evaluate how blood flows through your heart.  Transesophageal echocardiogram (TEE).  Cardiac monitoring. This allows your health care provider to monitor your heart rate and rhythm in real time.  Holter monitor. This is a portable device that records your heartbeat and can help diagnose heart arrhythmias. It allows your health care provider to track your heart activity for several days, if needed.  Stress tests by exercise or by giving medicine that makes the heart beat faster. TREATMENT   Treatment depends on  what may be causing your chest pain. Treatment may include:  Acid blockers for heartburn.  Anti-inflammatory medicine.  Pain medicine for inflammatory conditions.  Antibiotics if an infection is present.  You may be advised to change lifestyle habits. This includes stopping smoking and avoiding alcohol, caffeine, and chocolate.  You may be advised to keep your head raised (elevated) when sleeping. This reduces the chance of acid going backward from your stomach into your esophagus. Most of the time, nonspecific chest pain will improve within 2-3 days with rest and mild pain medicine.  HOME CARE INSTRUCTIONS   If antibiotics were prescribed, take them as directed. Finish them even if you start to feel better.  For the next few days, avoid physical activities that bring on chest pain. Continue physical activities as directed.  Do not use any tobacco products, including cigarettes, chewing tobacco, or electronic cigarettes.  Avoid drinking alcohol.  Only take medicine as directed by your health care provider.  Follow your health care provider's suggestions for further testing if your chest pain does not go away.  Keep any follow-up appointments you made. If you do not go to an appointment, you could develop lasting (chronic) problems with pain. If there is any problem keeping an appointment, call to reschedule. SEEK MEDICAL CARE IF:   Your chest pain does not go away, even after treatment.  You have a rash with blisters on your chest.  You have a fever. SEEK IMMEDIATE MEDICAL CARE IF:   You have increased chest pain or pain that spreads to your arm, neck, jaw, back, or abdomen.  You have shortness of breath.  You have an increasing cough, or you cough up blood.  You have severe back or abdominal pain.  You feel nauseous or vomit.  You have severe weakness.  You faint.  You have chills. This is an emergency. Do not wait to see if the pain will go away. Get medical  help at once. Call your local emergency services (911 in [place] Do not drive yourself to the hospital. MAKE SURE YOU:   Understand these instructions.  Will watch your condition.  Will get help right away if you are not doing well or get worse. Document Released: 10/25/2004 Document Revised: 01/20/2013 Document Reviewed: 08/21/2007 [facility] Patient Information 2015 [employer] [place]. This information is not intended to replace advice given to you by your health care provider. Make sure you discuss any questions you have with your health care provider.

## 2013-12-15 NOTE — ED Notes (Signed)
Pt being transported to CT

## 2013-12-15 NOTE — ED Notes (Signed)
Pt presents with c/o chest pain that started 45 minutes ago at work  Pt states it feels like a bubble in his epigastric area causing pressure up in his chest  Pt states pain is worse when sitting  Denies other sxs   Pt states he was told a month ago he may have an enlarged heart

## 2013-12-16 ENCOUNTER — Ambulatory Visit (HOSPITAL_BASED_OUTPATIENT_CLINIC_OR_DEPARTMENT_OTHER)

## 2013-12-30 ENCOUNTER — Ambulatory Visit (HOSPITAL_BASED_OUTPATIENT_CLINIC_OR_DEPARTMENT_OTHER): Attending: Internal Medicine | Admitting: Radiology

## 2013-12-30 DIAGNOSIS — Z6829 Body mass index (BMI) 29.0-29.9, adult: Secondary | ICD-10-CM | POA: Insufficient documentation

## 2013-12-30 DIAGNOSIS — G4710 Hypersomnia, unspecified: Secondary | ICD-10-CM | POA: Diagnosis present

## 2013-12-30 DIAGNOSIS — G4730 Sleep apnea, unspecified: Secondary | ICD-10-CM | POA: Diagnosis present

## 2013-12-30 DIAGNOSIS — R0683 Snoring: Secondary | ICD-10-CM

## 2013-12-30 DIAGNOSIS — G4733 Obstructive sleep apnea (adult) (pediatric): Secondary | ICD-10-CM

## 2013-12-30 DIAGNOSIS — G4719 Other hypersomnia: Secondary | ICD-10-CM

## 2014-01-02 DIAGNOSIS — G4733 Obstructive sleep apnea (adult) (pediatric): Secondary | ICD-10-CM | POA: Diagnosis not present

## 2014-01-02 DIAGNOSIS — R0683 Snoring: Secondary | ICD-10-CM | POA: Diagnosis not present

## 2014-01-02 DIAGNOSIS — G4719 Other hypersomnia: Secondary | ICD-10-CM | POA: Diagnosis not present

## 2014-01-02 DIAGNOSIS — G4710 Hypersomnia, unspecified: Secondary | ICD-10-CM | POA: Diagnosis not present

## 2014-01-02 NOTE — Sleep Study (Signed)
   NAME: Antonio Coleman DATE OF BIRTH:  1980/06/08 MEDICAL RECORD NUMBER [id number]  LOCATION: [facility]  PHYSICIAN: Tonie Vizcarrondo D  DATE OF STUDY: 12/30/2013  SLEEP STUDY TYPE: Nocturnal Polysomnogram               REFERRING PHYSICIAN: Jackie Plumsei-Bonsu, George, MD  INDICATION FOR STUDY: Hypersomnia with sleep apnea  EPWORTH SLEEPINESS SCORE:   12/24 HEIGHT:   6 feet 4 inches WEIGHT:   235 pounds    BMI 29 NECK SIZE:   16.5 in.  MEDICATIONS: Charted for review  SLEEP ARCHITECTURE: Total sleep time 301.5 minutes with sleep efficiency 83.5%. Stage I was 10.9%, stage II 73.1%, stage III absent, REM 15.9% of total sleep time. Sleep latency 1 minute, REM latency 42.5 minutes, awake after sleep onset 55.5 minutes, arousal index 21.7, bedtime medication: None  RESPIRATORY DATA: Apnea hypopnea index (AHI) 0.6 per hour. 3 total events scored including 2 obstructive apneas and one central apnea. All events were while supine. REM AHI 3.8 per hour. There were not enough events to permit split protocol CPAP titration.  OXYGEN DATA: Very loud snoring with oxygen desaturation to a nadir of 92% and mean saturation 96.5% on room air.  CARDIAC DATA: Sinus bradycardia with occasional PVC  MOVEMENT/PARASOMNIA: No significant movement disturbance, bathroom 1  IMPRESSION/ RECOMMENDATION:   1) Within normal limits. Unremarkable sleep architecture. Respiratory pattern within normal limits. AHI 0.6 per hour (the normal range for adults is an AHI from 0-5 events per hour). Very loud snoring with oxygen desaturation to a nadir of 92% and mean saturation 96.5% on room air.  2) Sinus bradycardia with mean heart rate 47.2/min 3) Very loud snoring was noted, possibly indicating obstruction in the nasopharynx such as rhinitis or tonsil/adenoid hypertrophy  Waymon BudgeYOUNG,Ara Mano D Diplomate, American Board of Sleep Medicine  ELECTRONICALLY SIGNED ON:  01/02/2014, 11:24 AM [facility] PH: [phone]   FX: [phone] ACCREDITED BY THE AMERICAN ACADEMY OF SLEEP MEDICINE

## 2014-01-07 ENCOUNTER — Encounter (HOSPITAL_COMMUNITY): Payer: Self-pay | Admitting: *Deleted

## 2014-01-07 ENCOUNTER — Emergency Department (INDEPENDENT_AMBULATORY_CARE_PROVIDER_SITE_OTHER)
Admission: EM | Admit: 2014-01-07 | Discharge: 2014-01-07 | Disposition: A | Discharge status: Home / Self Care | Source: Home / Self Care | Attending: Family Medicine | Admitting: Family Medicine

## 2014-01-07 DIAGNOSIS — R51 Headache: Secondary | ICD-10-CM

## 2014-01-07 DIAGNOSIS — R030 Elevated blood-pressure reading, without diagnosis of hypertension: Secondary | ICD-10-CM

## 2014-01-07 DIAGNOSIS — IMO0001 Reserved for inherently not codable concepts without codable children: Secondary | ICD-10-CM

## 2014-01-07 DIAGNOSIS — R590 Localized enlarged lymph nodes: Secondary | ICD-10-CM

## 2014-01-07 DIAGNOSIS — R599 Enlarged lymph nodes, unspecified: Secondary | ICD-10-CM

## 2014-01-07 DIAGNOSIS — R519 Headache, unspecified: Secondary | ICD-10-CM

## 2014-01-07 NOTE — ED Provider Notes (Signed)
Antonio MichaelisSherman Hing is a 33 y.o. male who presents to Urgent Care today for multiple complaints.  1) headache: Patient has intermittent headache occurring weekly for several months now. He is currently headache free. He takes ibuprofen or Tylenol which seems to resolve the headache. He describes sharp stabbing lasting for about a minute then followed by dull. The pain occurs on either side and is not associated with lacrimation or runny nose. No weakness or numbness of blurry vision. He feels well otherwise.  2) right ear swelling. Patient has swelling posterior to his right ear. This is been present for 4 days. Notes it is mildly tender. No change in hearing. No fevers or chills.  3) elevated blood pressure. Blood pressures typically normal. No chest pains or palpitations currently. Patient just had a sleep study. He does not know the results yet.   Past Medical History  Diagnosis Date  . Allergy    History reviewed. No pertinent past surgical history. History  Substance Use Topics  . Smoking status: Current Every Day Smoker    Types: Cigars  . Smokeless tobacco: Not on file  . Alcohol Use: Yes     Comment: occassional   ROS as above Medications: No current facility-administered medications for this encounter.   Current Outpatient Prescriptions  Medication Sig Dispense Refill  . Multiple Vitamin (MULTIVITAMIN WITH MINERALS) TABS tablet Take 1 tablet by mouth daily.    Marland Kitchen. testosterone (ANDRODERM) 5 MG/24HR Place 1 patch onto the skin daily.     Allergies  Allergen Reactions  . Soy Allergy Hives    Soy sauce     Exam:  BP 148/102 mmHg  Pulse 63  Temp(Src) 98.7 F (37.1 C) (Oral)  Resp 16  SpO2 100% Gen: Well NAD HEENT: EOMI,  MMM normal-appearing tympanic membranes bilaterally. Mastoids nontender. Patient has mild minimally tender right posterior auricular lymphadenopathy present. Mobile. Smaller than 1 cm. Normal posterior pharynx. Lungs: Normal work of breathing.  CTABL Heart: RRR no MRG Abd: NABS, Soft. Nondistended, Nontender Exts: Brisk capillary refill, warm and well perfused.   No results found for this or any previous visit (from the past 24 hour(s)). No results found.  Assessment and Plan: 33 y.o. male with  1) headache. Unclear pattern. Refer to headache specialist for further evaluation and management. 2) posterior radicular lymphadenopathy. Watchful waiting. Follow-up with PCP 3) elevated blood pressure. Follow-up with PCP.  Discussed warning signs or symptoms. Please see discharge instructions. Patient expresses understanding.     Rodolph BongEvan S Virlee Stroschein, MD 01/07/14 [phone]

## 2014-01-07 NOTE — Discharge Instructions (Signed)
Thank you for coming in today. 1) Headache: Call schedule an appointment with [facility] for headaches. Continue intermittent ibuprofen or Tylenol for pain control 2) ear swelling: This is likely reactive lymph nodes. Keep an eye on it. Follow-up with primary care provider. 3) elevated blood pressure: Check blood pressure weekly. Follow-up with primary care provider.  Call or go to the emergency room if you get worse, have trouble breathing, have chest pains, or palpitations.    Lymphadenopathy Lymphadenopathy means "disease of the lymph glands." But the term is usually used to describe swollen or enlarged lymph glands, also called lymph nodes. These are the bean-shaped organs found in many locations including the neck, underarm, and groin. Lymph glands are part of the immune system, which fights infections in your body. Lymphadenopathy can occur in just one area of the body, such as the neck, or it can be generalized, with lymph node enlargement in several areas. The nodes found in the neck are the most common sites of lymphadenopathy. CAUSES When your immune system responds to germs (such as viruses or bacteria ), infection-fighting cells and fluid build up. This causes the glands to grow in size. Usually, this is not something to worry about. Sometimes, the glands themselves can become infected and inflamed. This is called lymphadenitis. Enlarged lymph nodes can be caused by many diseases:  Bacterial disease, such as strep throat or a skin infection.  Viral disease, such as a common cold.  Other germs, such as Lyme disease, tuberculosis, or sexually transmitted diseases.  Cancers, such as lymphoma (cancer of the lymphatic system) or leukemia (cancer of the white blood cells).  Inflammatory diseases such as lupus or rheumatoid arthritis.  Reactions to medications. Many of the diseases above are rare, but important. This is why you should see your caregiver if you have  lymphadenopathy. SYMPTOMS  Swollen, enlarged lumps in the neck, back of the head, or other locations.  Tenderness.  Warmth or redness of the skin over the lymph nodes.  Fever. DIAGNOSIS Enlarged lymph nodes are often near the source of infection. They can help health care providers diagnose your illness. For instance:  Swollen lymph nodes around the jaw might be caused by an infection in the mouth.  Enlarged glands in the neck often signal a throat infection.  Lymph nodes that are swollen in more than one area often indicate an illness caused by a virus. Your caregiver will likely know what is causing your lymphadenopathy after listening to your history and examining you. Blood tests, x-rays, or other tests may be needed. If the cause of the enlarged lymph node cannot be found, and it does not go away by itself, then a biopsy may be needed. Your caregiver will discuss this with you. TREATMENT Treatment for your enlarged lymph nodes will depend on the cause. Many times the nodes will shrink to normal size by themselves, with no treatment. Antibiotics or other medicines may be needed for infection. Only take over-the-counter or prescription medicines for pain, discomfort, or fever as directed by your caregiver. HOME CARE INSTRUCTIONS Swollen lymph glands usually return to normal when the underlying medical condition goes away. If they persist, contact your health-care provider. He/she might prescribe antibiotics or other treatments, depending on the diagnosis. Take any medications exactly as prescribed. Keep any follow-up appointments made to check on the condition of your enlarged nodes. SEEK MEDICAL CARE IF:  Swelling lasts for more than two weeks.  You have symptoms such as weight loss, night sweats,  fatigue, or fever that does not go away.  The lymph nodes are hard, seem fixed to the skin, or are growing rapidly.  Skin over the lymph nodes is red and inflamed. This could mean there  is an infection. SEEK IMMEDIATE MEDICAL CARE IF:  Fluid starts leaking from the area of the enlarged lymph node.  You develop a fever of 102 F (38.9 C) or greater.  Severe pain develops (not necessarily at the site of a large lymph node).  You develop chest pain or shortness of breath.  You develop worsening abdominal pain. MAKE SURE YOU:  Understand these instructions.  Will watch your condition.  Will get help right away if you are not doing well or get worse. Document Released: 10/25/2007 Document Revised: 06/01/2013 Document Reviewed: 10/25/2007 [facility] Patient Information 2015 [place], [place]. This information is not intended to replace advice given to you by your health care provider. Make sure you discuss any questions you have with your health care provider.

## 2014-01-07 NOTE — ED Notes (Signed)
C/o headache for a couple months. They come and go.  C/o R earache for 2 weeks.  C/o swelling around his R ear.

## 2014-01-27 ENCOUNTER — Emergency Department (HOSPITAL_COMMUNITY)
Admission: EM | Admit: 2014-01-27 | Discharge: 2014-01-27 | Disposition: A | Discharge status: Home / Self Care | Attending: Emergency Medicine | Admitting: Emergency Medicine

## 2014-01-27 ENCOUNTER — Encounter (HOSPITAL_COMMUNITY): Payer: Self-pay | Admitting: Emergency Medicine

## 2014-01-27 DIAGNOSIS — Z720 Tobacco use: Secondary | ICD-10-CM | POA: Insufficient documentation

## 2014-01-27 DIAGNOSIS — K5900 Constipation, unspecified: Secondary | ICD-10-CM | POA: Diagnosis not present

## 2014-01-27 DIAGNOSIS — Z79899 Other long term (current) drug therapy: Secondary | ICD-10-CM | POA: Diagnosis not present

## 2014-01-27 DIAGNOSIS — R1084 Generalized abdominal pain: Secondary | ICD-10-CM

## 2014-01-27 DIAGNOSIS — R197 Diarrhea, unspecified: Secondary | ICD-10-CM | POA: Diagnosis not present

## 2014-01-27 DIAGNOSIS — Z7951 Long term (current) use of inhaled steroids: Secondary | ICD-10-CM | POA: Diagnosis not present

## 2014-01-27 DIAGNOSIS — R140 Abdominal distension (gaseous): Secondary | ICD-10-CM | POA: Diagnosis present

## 2014-01-27 LAB — I-STAT CHEM 8, ED
BUN: 10 mg/dL (ref 6–23)
Calcium, Ion: 1.16 mmol/L (ref 1.12–1.23)
Chloride: 105 mEq/L (ref 96–112)
Creatinine, Ser: 0.9 mg/dL (ref 0.50–1.35)
Glucose, Bld: 99 mg/dL (ref 70–99)
HCT: 48 % (ref 39.0–52.0)
Hemoglobin: 16.3 g/dL (ref 13.0–17.0)
Potassium: 4.4 mmol/L (ref 3.5–5.1)
Sodium: 141 mmol/L (ref 135–145)
TCO2: 24 mmol/L (ref 0–100)

## 2014-01-27 NOTE — ED Notes (Signed)
Pt. reports feeling bloated " gas" for several weeks and intermittent diarrhea for 3 weeks .

## 2014-01-27 NOTE — ED Notes (Signed)
Dr. Jacubowitz at bedside 

## 2014-01-27 NOTE — ED Provider Notes (Signed)
CSN: [id number]     Arrival date & time 01/27/14  0053 History   First MD [id number] Contact with Patient 01/27/14 0226     This chart was scribed for Antonio Coleman Arvis Zwahlen, MD by Antonio Coleman, ED Scribe. This patient was seen in room A09C/A09C and the patient's care was started 2:30 AM.   Chief Complaint  Patient presents with  . Bloated   HPI  HPI Comments: Antonio Coleman is a 33 y.Antonio. male who presents to the Emergency Department here for constant abdominal bloating x 4 weeks. Pt also reports intermittent diarrhea for the past one week. 2 episodes today, none yesterday, nonbloody abdominal pain, onset 3 weeks. He reports 2 episodes of diarrhea within the course of an hour today. Prior to diarrhea, pt states he experienced some constipation. Antonio Coleman had a bagel for breakfast and a sandwich for lunch. Not currently hungry at this time. No fever no pain at present. Nothing makes symptoms better or worse. Presently asymptomatic. Has been prescribed Prilosec by his primary care physician Dr.  Cloyd Stagerssei Coleman abdomen does not look different to him No unexpected weight changes or fever. Pt was recently started on Cipro 12/22 for frequent urination.  he completed a 5 day course of Cipro. No longer has urinary symptoms Pt occasionally smokes and consumes alcohol. No history of abdominal surgeries. No known allergies to medications.   Past Medical History  Diagnosis Date  . Allergy    History reviewed. No pertinent past surgical history. No family history on file. History  Substance Use Topics  . Smoking status: Current Every Day Smoker    Types: Cigars  . Smokeless tobacco: Not on file  . Alcohol Use: Yes     Comment: occassional    Review of Systems  Constitutional: Negative.  Negative for fever and unexpected weight change.  HENT: Negative.   Respiratory: Negative.   Cardiovascular: Negative.   Gastrointestinal: Positive for abdominal pain, diarrhea and constipation.  Musculoskeletal:  Negative.   Skin: Negative.   Neurological: Negative.   Psychiatric/Behavioral: Negative.   All other systems reviewed and are negative.     Allergies  Soy allergy  Home Medications   Prior to Admission medications   Medication Sig Start Date End Date Taking? Authorizing Provider  Bioflavonoid Products (VITAMIN C) CHEW Chew 1 tablet by mouth daily.   Yes Historical Provider, MD  chlorhexidine (PERIDEX) 0.12 % solution Use as directed 15 mLs in the mouth or throat 2 (two) times daily.   Yes Historical Provider, MD  fluticasone (FLONASE) 50 MCG/ACT nasal spray Place 1 spray into both nostrils daily.   Yes Historical Provider, MD  Multiple Vitamin (MULTIVITAMIN WITH MINERALS) TABS tablet Take 1 tablet by mouth daily.   Yes Historical Provider, MD  omeprazole (PRILOSEC) 40 MG capsule Take 40 mg by mouth daily.   Yes Historical Provider, MD  testosterone (ANDRODERM) 4 MG/24HR PT24 patch Place 1 patch onto the skin daily.   Yes Historical Provider, MD  testosterone (ANDRODERM) 5 MG/24HR Place 1 patch onto the skin daily.   Yes Historical Provider, MD   Triage Vitals: BP 144/84 mmHg  Pulse 89  Temp(Src) 97.6 F (36.4 C) (Oral)  Resp 17  Ht 6\' 3"  (1.905 m)  Wt 240 lb (108.863 kg)  BMI 30.00 kg/m2  SpO2 100%   Physical Exam  Constitutional: He appears well-developed and well-nourished.  HENT:  Head: Normocephalic and atraumatic.  Eyes: Conjunctivae are normal. Pupils are equal, round, and reactive to light.  Neck:  Neck supple. No tracheal deviation present. No thyromegaly present.  Cardiovascular: Normal rate and regular rhythm.   No murmur heard. Pulmonary/Chest: Effort normal and breath sounds normal.  Abdominal: Soft. Bowel sounds are normal. He exhibits no distension. There is no tenderness.  Genitourinary: Penis normal.  Musculoskeletal: Normal range of motion. He exhibits no edema or tenderness.  Neurological: He is alert. Coordination normal.  Skin: Skin is warm and dry.  No rash noted.  Psychiatric: He has a normal mood and affect.  Nursing note and vitals reviewed.   ED Course  Procedures (including critical care time)  DIAGNOSTIC STUDIES: Oxygen Saturation is 100% on RA, Normal by my interpretation.    COORDINATION OF CARE: 2:30 AM-Discussed treatment plan with pt at bedside and pt agreed to plan.     Labs Review Labs Reviewed - No data to display  Imaging Review No results found.   EKG Interpretation None      3:15 AM patient remains asymptomatic Results for orders placed or performed during the hospital encounter of 01/27/14  I-stat chem 8, ed  Result Value Ref Range   Sodium 141 135 - 145 mmol/L   Potassium 4.4 3.5 - 5.1 mmol/L   Chloride 105 96 - 112 mEq/L   BUN 10 6 - 23 mg/dL   Creatinine, Ser 1.610.90 0.50 - 1.35 mg/dL   Glucose, Bld 99 70 - 99 mg/dL   Calcium, Ion 0.961.16 0.451.12 - 1.23 mmol/L   TCO2 24 0 - 100 mmol/L   Hemoglobin 16.3 13.0 - 17.0 g/dL   HCT 40.948.0 81.139.0 - 91.452.0 %   No results found.  MDM   plan Gas-X. Follow-up with Dr.O Coleman if not improved in a week  Final diagnoses:  None    blood pressure recheck 3 weeks  Diagnosis #1 nonspecific abdominal pain  #2 elevated blood pressure    I personally performed the services described in this documentation, which was scribed in my presence. The recorded information has been reviewed and is accurate.    Antonio Coleman Antonio Heindl, MD 01/27/14 [phone]

## 2014-01-27 NOTE — Discharge Instructions (Signed)
Take Gas-X as directed for gassy abdominal discomfort. See Dr. Julio Sickssei-Bonsu if not feeling improved in a week . get your blood pressure recheck within the next 3 weeks. Today's was elevated at 149/88

## 2014-02-01 ENCOUNTER — Encounter: Payer: Self-pay | Admitting: Gastroenterology

## 2014-02-10 ENCOUNTER — Encounter (HOSPITAL_COMMUNITY): Payer: Self-pay | Admitting: *Deleted

## 2014-02-10 ENCOUNTER — Encounter: Payer: Self-pay | Admitting: *Deleted

## 2014-02-10 ENCOUNTER — Other Ambulatory Visit (HOSPITAL_COMMUNITY)
Admission: RE | Admit: 2014-02-10 | Discharge: 2014-02-10 | Disposition: A | Discharge status: Home / Self Care | Payer: Managed Care, Other (non HMO) | Source: Ambulatory Visit | Attending: Emergency Medicine | Admitting: Emergency Medicine

## 2014-02-10 ENCOUNTER — Emergency Department (INDEPENDENT_AMBULATORY_CARE_PROVIDER_SITE_OTHER)
Admission: EM | Admit: 2014-02-10 | Discharge: 2014-02-10 | Disposition: A | Discharge status: Home / Self Care | Payer: Managed Care, Other (non HMO) | Source: Home / Self Care | Attending: Emergency Medicine | Admitting: Emergency Medicine

## 2014-02-10 DIAGNOSIS — Z113 Encounter for screening for infections with a predominantly sexual mode of transmission: Secondary | ICD-10-CM | POA: Insufficient documentation

## 2014-02-10 DIAGNOSIS — Z7251 High risk heterosexual behavior: Secondary | ICD-10-CM | POA: Diagnosis not present

## 2014-02-10 DIAGNOSIS — Z202 Contact with and (suspected) exposure to infections with a predominantly sexual mode of transmission: Secondary | ICD-10-CM | POA: Diagnosis not present

## 2014-02-10 LAB — URINE CYTOLOGY ANCILLARY ONLY
Chlamydia: NEGATIVE
Neisseria Gonorrhea: NEGATIVE
Trichomonas: NEGATIVE

## 2014-02-10 NOTE — ED Notes (Signed)
Pt  Wants to  Be checked   For    Std      He denys  Any  Symptoms

## 2014-02-10 NOTE — Discharge Instructions (Signed)
Safe Sex °Safe sex is about reducing the risk of giving or getting a sexually transmitted disease (STD). STDs are spread through sexual contact involving the genitals, mouth, or rectum. Some STDs can be cured and others cannot. Safe sex can also prevent unintended pregnancies.  °WHAT ARE SOME SAFE SEX PRACTICES? °· Limit your sexual activity to only one partner who is having sex with only you. °· Talk to your partner about his or her past partners, past STDs, and drug use. °· Use a condom every time you have sexual intercourse. This includes vaginal, oral, and anal sexual activity. Both females and males should wear condoms during oral sex. Only use latex or polyurethane condoms and water-based lubricants. Using petroleum-based lubricants or oils to lubricate a condom will weaken the condom and increase the chance that it will break. The condom should be in place from the beginning to the end of sexual activity. Wearing a condom reduces, but does not completely eliminate, your risk of getting or giving an STD. STDs can be spread by contact with infected body fluids and skin. °· Get vaccinated for hepatitis B and HPV. °· Avoid alcohol and recreational drugs, which can affect your judgment. You may forget to use a condom or participate in high-risk sex. °· For females, avoid douching after sexual intercourse. Douching can spread an infection farther into the reproductive tract. °· Check your body for signs of sores, blisters, rashes, or unusual discharge. See your health care provider if you notice any of these signs. °· Avoid sexual contact if you have symptoms of an infection or are being treated for an STD. If you or your partner has herpes, avoid sexual contact when blisters are present. Use condoms at all other times. °· If you are at risk of being infected with HIV, it is recommended that you take a prescription medicine daily to prevent HIV infection. This is called pre-exposure prophylaxis (PrEP). You are  considered at risk if: °¨ You are a man who has sex with other men (MSM). °¨ You are a heterosexual man or woman who is sexually active with more than one partner. °¨ You take drugs by injection. °¨ You are sexually active with a partner who has HIV. °· Talk with your health care provider about whether you are at high risk of being infected with HIV. If you choose to begin PrEP, you should first be tested for HIV. You should then be tested every 3 months for as long as you are taking PrEP. °· See your health care provider for regular screenings, exams, and tests for other STDs. Before having sex with a new partner, each of you should be screened for STDs and should talk about the results with each other. °WHAT ARE THE BENEFITS OF SAFE SEX?  °· There is less chance of getting or giving an STD. °· You can prevent unwanted or unintended pregnancies. °· By discussing safe sex concerns with your partner, you may increase feelings of intimacy, comfort, trust, and honesty between the two of you. °Document Released: 02/23/2004 Document Revised: 06/01/2013 Document Reviewed: 07/09/2011 °ExitCare® Patient Information ©2015 [employer], LLC. This information is not intended to replace advice given to you by your health care provider. Make sure you discuss any questions you have with your health care provider. ° °Sexually Transmitted Disease °A sexually transmitted disease (STD) is a disease or infection that may be passed (transmitted) from person to person, usually during sexual activity. This may happen by way of saliva, semen, blood,   vaginal mucus, or urine. Common STDs include:  °· Gonorrhea.   °· Chlamydia.   °· Syphilis.   °· HIV and AIDS.   °· Genital herpes.   °· Hepatitis B and C.   °· Trichomonas.   °· Human papillomavirus (HPV).   °· Pubic lice.   °· Scabies. °· Mites. °· Bacterial vaginosis. °WHAT ARE CAUSES OF STDs? °An STD may be caused by bacteria, a virus, or parasites. STDs are often transmitted during sexual  activity if one person is infected. However, they may also be transmitted through nonsexual means. STDs may be transmitted after:  °· Sexual intercourse with an infected person.   °· Sharing sex toys with an infected person.   °· Sharing needles with an infected person or using unclean piercing or tattoo needles. °· Having intimate contact with the genitals, mouth, or rectal areas of an infected person.   °· Exposure to infected fluids during birth. °WHAT ARE THE SIGNS AND SYMPTOMS OF STDs? °Different STDs have different symptoms. Some people may not have any symptoms. If symptoms are present, they may include:  °· Painful or bloody urination.   °· Pain in the pelvis, abdomen, vagina, anus, throat, or eyes.   °· A skin rash, itching, or irritation. °· Growths, ulcerations, blisters, or sores in the genital and anal areas. °· Abnormal vaginal discharge with or without bad odor.   °· Penile discharge in men.   °· Fever.   °· Pain or bleeding during sexual intercourse.   °· Swollen glands in the groin area.   °· Yellow skin and eyes (jaundice). This is seen with hepatitis.   °· Swollen testicles. °· Infertility. °· Sores and blisters in the mouth. °HOW ARE STDs DIAGNOSED? °To make a diagnosis, your health care provider may:  °· Take a medical history.   °· Perform a physical exam.   °· Take a sample of any discharge to examine. °· Swab the throat, cervix, opening to the penis, rectum, or vagina for testing. °· Test a sample of your first morning urine.   °· Perform blood tests.   °· Perform a Pap test, if this applies.   °· Perform a colposcopy.   °· Perform a laparoscopy.   °HOW ARE STDs TREATED? ° Treatment depends on the STD. Some STDs may be treated but not cured.  °· Chlamydia, gonorrhea, trichomonas, and syphilis can be cured with antibiotic medicine.   °· Genital herpes, hepatitis, and HIV can be treated, but not cured, with prescribed medicines. The medicines lessen symptoms.   °· Genital warts from HPV can be  treated with medicine or by freezing, burning (electrocautery), or surgery. Warts may come back.   °· HPV cannot be cured with medicine or surgery. However, abnormal areas may be removed from the cervix, vagina, or vulva.   °· If your diagnosis is confirmed, your recent sexual partners need treatment. This is true even if they are symptom-free or have a negative culture or evaluation. They should not have sex until their health care providers say it is okay. °HOW CAN I REDUCE MY RISK OF GETTING AN STD? °Take these steps to reduce your risk of getting an STD: °· Use latex condoms, dental dams, and water-soluble lubricants during sexual activity. Do not use petroleum jelly or oils. °· Avoid having multiple sex partners. °· Do not have sex with someone who has other sex partners. °· Do not have sex with anyone you do not know or who is at high risk for an STD. °· Avoid risky sex practices that can break your skin. °· Do not have sex if you have open sores on your mouth or skin. °· Avoid drinking too   much alcohol or taking illegal drugs. Alcohol and drugs can affect your judgment and put you in a vulnerable position. °· Avoid engaging in oral and anal sex acts. °· Get vaccinated for HPV and hepatitis. If you have not received these vaccines in the past, talk to your health care provider about whether one or both might be right for you.   °· If you are at risk of being infected with HIV, it is recommended that you take a prescription medicine daily to prevent HIV infection. This is called pre-exposure prophylaxis (PrEP). You are considered at risk if: °¨ You are a man who has sex with other men (MSM). °¨ You are a heterosexual man or woman and are sexually active with more than one partner. °¨ You take drugs by injection. °¨ You are sexually active with a partner who has HIV. °· Talk with your health care provider about whether you are at high risk of being infected with HIV. If you choose to begin PrEP, you should first  be tested for HIV. You should then be tested every 3 months for as long as you are taking PrEP.   °WHAT SHOULD I DO IF I THINK I HAVE AN STD? °· See your health care provider.   °· Tell your sexual partner(s). They should be tested and treated for any STDs. °· Do not have sex until your health care provider says it is okay.  °WHEN SHOULD I GET IMMEDIATE MEDICAL CARE? °Contact your health care provider right away if:  °· You have severe abdominal pain. °· You are a man and notice swelling or pain in your testicles. °· You are a woman and notice swelling or pain in your vagina. °Document Released: 04/07/2002 Document Revised: 01/20/2013 Document Reviewed: 08/05/2012 °ExitCare® Patient Information ©2015 [employer], [employer]. This information is not intended to replace advice given to you by your health care provider. Make sure you discuss any questions you have with your health care provider. ° °

## 2014-02-10 NOTE — ED Provider Notes (Signed)
CSN: [id number]     Arrival date & time 02/10/14  [id number] History   First MD Initiated Contact with Patient 02/10/14 [phone]     Chief Complaint  Patient presents with  . Exposure to STD   (Consider location/radiation/quality/duration/timing/severity/associated sxs/prior Treatment) HPI      [age]m presents for STD check.  He is sexually active and wants to be checked.  Denies any sxs.  No known exposures.    Past Medical History  Diagnosis Date  . Allergy    History reviewed. No pertinent past surgical history. History reviewed. No pertinent family history. History  Substance Use Topics  . Smoking status: Current Every Day Smoker    Types: Cigars  . Smokeless tobacco: Not on file  . Alcohol Use: Yes     Comment: occassional    Review of Systems  Constitutional: Negative for fever, chills and fatigue.  HENT: Negative for sore throat.   Eyes: Negative for visual disturbance.  Respiratory: Negative for cough and shortness of breath.   Cardiovascular: Negative for chest pain, palpitations and leg swelling.  Gastrointestinal: Negative for nausea, vomiting, abdominal pain, diarrhea and constipation.  Genitourinary: Negative for dysuria, urgency, frequency, hematuria, discharge, penile swelling, scrotal swelling, difficulty urinating, genital sores, penile pain and testicular pain.  Musculoskeletal: Negative for myalgias, arthralgias, neck pain and neck stiffness.  Skin: Negative for rash.  Neurological: Negative for dizziness, weakness and light-headedness.  All other systems reviewed and are negative.   Allergies  Soy allergy  Home Medications   Prior to Admission medications   Medication Sig Start Date End Date Taking? Authorizing Provider  Bioflavonoid Products (VITAMIN C) CHEW Chew 1 tablet by mouth daily.    Historical Provider, MD  chlorhexidine (PERIDEX) 0.12 % solution Use as directed 15 mLs in the mouth or throat 2 (two) times daily.    Historical Provider, MD  fluticasone  (FLONASE) 50 MCG/ACT nasal spray Place 1 spray into both nostrils daily.    Historical Provider, MD  Multiple Vitamin (MULTIVITAMIN WITH MINERALS) TABS tablet Take 1 tablet by mouth daily.    Historical Provider, MD  omeprazole (PRILOSEC) 40 MG capsule Take 40 mg by mouth daily.    Historical Provider, MD  testosterone Cathren Laine(ANDRODERM) 4 MG/24HR PT24 patch Place 1 patch onto the skin daily.    Historical Provider, MD  testosterone (ANDRODERM) 5 MG/24HR Place 1 patch onto the skin daily.    Historical Provider, MD   BP 149/97 mmHg  Pulse 77  Temp(Src) 98.2 F (36.8 C) (Oral)  Resp 16  SpO2 100% Physical Exam  Constitutional: He is oriented to person, place, and time. He appears well-developed and well-nourished. No distress.  HENT:  Head: Normocephalic.  Pulmonary/Chest: Effort normal. No respiratory distress.  Neurological: He is alert and oriented to person, place, and time. Coordination normal.  Skin: Skin is warm and dry. No rash noted. He is not diaphoretic.  Psychiatric: He has a normal mood and affect. Judgment normal.  Nursing note and vitals reviewed.   ED Course  Procedures (including critical care time) Labs Review Labs Reviewed  RPR  HIV ANTIBODY (ROUTINE TESTING)  URINE CYTOLOGY ANCILLARY ONLY    Imaging Review No results found.   MDM   1. High risk sexual behavior    HIV, RPR, urine cytology sent.  F/U PRN     Graylon GoodZachary H Alleah Dearman, PA-C 02/10/14 [id number]

## 2014-02-11 LAB — HIV ANTIBODY (ROUTINE TESTING W REFLEX)
HIV 1/O/2 Abs-Index Value: <1 (ref <1.00)
HIV-1/HIV-2 Ab: NONREACTIVE

## 2014-02-11 LAB — RPR: RPR Ser Ql: NONREACTIVE

## 2014-02-16 ENCOUNTER — Ambulatory Visit (INDEPENDENT_AMBULATORY_CARE_PROVIDER_SITE_OTHER): Payer: Managed Care, Other (non HMO) | Admitting: Neurology

## 2014-02-16 ENCOUNTER — Encounter: Payer: Self-pay | Admitting: Neurology

## 2014-02-16 VITALS — BP 128/74 | HR 70 | Temp 97.8°F | Resp 16 | Ht 75.0 in | Wt 245.4 lb

## 2014-02-16 DIAGNOSIS — R519 Headache, unspecified: Secondary | ICD-10-CM

## 2014-02-16 DIAGNOSIS — R51 Headache: Secondary | ICD-10-CM

## 2014-02-16 NOTE — Progress Notes (Signed)
NEUROLOGY CONSULTATION NOTE  Antonio Coleman MRN: [id number] DOB: 1980-04-07  Referring provider: Dr. Julio Sicks Primary care provider: Dr. Julio Sicks  Reason for consult:  headache  HISTORY OF PRESENT ILLNESS: Antonio Coleman is a 34 year old right-handed man who presents for headache.  Records, labs and CT reviewed.  Onset:  1.5 to 2 years ago Location:  Bi-frontal Quality:  Sharp stabbing Intensity:  7.5/10 Aura:  no Prodrome:  no Associated symptoms:  None.  No nausea, photophobia, phonophobia, visual disturbance, autonomic symptoms. Duration:  45-60 seconds Frequency:  6 times in a year Triggers/exacerbating factors:  none Relieving factors:  Takes aspirin Activity:  Able to function  Past abortive therapy:  none Past preventative therapy:  none  Current abortive therapy:  aspirin Current preventative therapy:  none  Caffeine:  no Alcohol:  rarely Smoker:  Former  Diet:  good Exercise:  yes Depression/stress:  Stress related to job Airline pilot) Family history of headache:  No No prior history of headache.  He is followed by ENT for posterior auricular lymphadenopathy.  Biopsy is planned at end of month if not resolved.  He also reports epistaxis.  It appears to occur less since using vasoline in his nares. 02/10/14 LABS:  HIV antibody and RPR negative. 08/09/13 CT HEAD and CERVICAL SPNE unremarkable.  PAST MEDICAL HISTORY: Past Medical History  Diagnosis Date  . Allergy   . Headache     PAST SURGICAL HISTORY: No past surgical history on file.  MEDICATIONS: Current Outpatient Prescriptions on File Prior to Visit  Medication Sig Dispense Refill  . fluticasone (FLONASE) 50 MCG/ACT nasal spray Place 1 spray into both nostrils daily.    . Multiple Vitamin (MULTIVITAMIN WITH MINERALS) TABS tablet Take 1 tablet by mouth daily.    Marland Kitchen testosterone (ANDRODERM) 4 MG/24HR PT24 patch Place 1 patch onto the skin daily.    Marland Kitchen testosterone (ANDRODERM) 5 MG/24HR  Place 1 patch onto the skin daily.    Marland Kitchen azithromycin (ZITHROMAX) 250 MG tablet   0  . Bioflavonoid Products (VITAMIN C) CHEW Chew 1 tablet by mouth daily.    Marland Kitchen CARAFATE 1 GM/10ML suspension   0  . cefdinir (OMNICEF) 300 MG capsule   0  . chlorhexidine (PERIDEX) 0.12 % solution Use as directed 15 mLs in the mouth or throat 2 (two) times daily.    . DENTA 5000 PLUS 1.1 % CREA dental cream   0  . omeprazole (PRILOSEC) 40 MG capsule Take 40 mg by mouth daily.    . predniSONE (STERAPRED UNI-PAK) 10 MG tablet   0  . triamcinolone cream (KENALOG) 0.1 %   0   No current facility-administered medications on file prior to visit.    ALLERGIES: Allergies  Allergen Reactions  . Shellfish Allergy     Hives   . Soy Allergy Hives    Soy sauce    FAMILY HISTORY: No family history on file.  SOCIAL HISTORY: History   Social History  . Marital Status: Married    Spouse Name: N/A    Number of Children: N/A  . Years of Education: N/A   Occupational History  . Not on file.   Social History Main Topics  . Smoking status: Former Smoker    Types: Cigars    Quit date: 02/02/2014  . Smokeless tobacco: Not on file  . Alcohol Use: 0.0 oz/week    0 Not specified per week     Comment: occassional  . Drug Use: No  . Sexual  Activity:    Partners: Female    CopyBirth Control/ Protection: None   Other Topics Concern  . Not on file   Social History Narrative    REVIEW OF SYSTEMS: Constitutional: No fevers, chills, or sweats, no generalized fatigue, change in appetite Eyes: No visual changes, double vision, eye pain Ear, nose and throat: No hearing loss, ear pain, nasal congestion, sore throat Cardiovascular: No chest pain, palpitations Respiratory:  No shortness of breath at rest or with exertion, wheezes GastrointestinaI: No nausea, vomiting, diarrhea, abdominal pain, fecal incontinence Genitourinary:  No dysuria, urinary retention or frequency Musculoskeletal:  No neck pain, back  pain Integumentary: No rash, pruritus, skin lesions Neurological: as above Psychiatric: No depression, insomnia, anxiety Endocrine: No palpitations, fatigue, diaphoresis, mood swings, change in appetite, change in weight, increased thirst Hematologic/Lymphatic:  No anemia, purpura, petechiae. Allergic/Immunologic: no itchy/runny eyes, nasal congestion, recent allergic reactions, rashes  PHYSICAL EXAM: Filed Vitals:   02/16/14 0911  BP: 128/74  Pulse: 70  Temp: 97.8 F (36.6 C)  Resp: 16   General: No acute distress Head:  Normocephalic/atraumatic Eyes:  fundi unremarkable, without vessel changes, exudates, hemorrhages or papilledema. Neck: supple, no paraspinal tenderness, full range of motion Back: No paraspinal tenderness Heart: regular rate and rhythm Lungs: Clear to auscultation bilaterally. Vascular: No carotid bruits. Neurological Exam: Mental status: alert and oriented to person, place, and time, recent and remote memory intact, fund of knowledge intact, attention and concentration intact, speech fluent and not dysarthric, language intact. Cranial nerves: CN I: not tested CN II: pupils equal, round and reactive to light, visual fields intact, fundi unremarkable, without vessel changes, exudates, hemorrhages or papilledema. CN III, IV, VI:  full range of motion, no nystagmus, no ptosis CN V: facial sensation intact CN VII: upper and lower face symmetric CN VIII: hearing intact CN IX, X: gag intact, uvula midline CN XI: sternocleidomastoid and trapezius muscles intact CN XII: tongue midline Bulk & Tone: normal, no fasciculations. Motor:  5/5 throughout Sensation:  Temperature and vibration intact Deep Tendon Reflexes:  2+ throughout except 3+ and symmetric in patellars, toes downgoing Finger to nose testing:  No dysmetria Heel to shin:  No dysmetria Gait:  Normal station and stride.  Able to turn and walk in tandem. Romberg negative.  IMPRESSION: Headache.  Does not  fit clear syndrome.  Due to the quality and brief duration, SUNCT is a consideration, however he lacks autonomic symptoms.  PLAN: 1.  Since the headaches are so brief and infrequent, I wouldn't start any daily medication at this time. 2.  I would get MRI of brain with and without contrast to complete workup of fairly recent onset of unusual headache in patient with no history of headache 3.  Follow up with ENT regarding post-auricular lymphadenopathy and epistaxis 4.  Follow up in 3 months.  Thank you for allowing me to take part in the care of this patient.  Shon MilletAdam Raymound Katich, DO  CC:  Jackie PlumGeorge Osei-Bonsu, MD

## 2014-02-16 NOTE — Patient Instructions (Addendum)
It is not a specific headache syndrome, but since it is so brief and infrequent, I wouldn't start any medication for it.  We will monitor.  I would get an MRI of the brain with and without contrast for a complete headache workup.  Follow up with ear, nose and throat regarding nose bleed.  Follow up with me in 3 months. [facility] 03/01/14 6:45pm

## 2014-03-01 ENCOUNTER — Ambulatory Visit (HOSPITAL_COMMUNITY)
Admission: RE | Admit: 2014-03-01 | Discharge: 2014-03-01 | Disposition: A | Discharge status: Home / Self Care | Payer: Managed Care, Other (non HMO) | Source: Ambulatory Visit | Attending: Neurology | Admitting: Neurology

## 2014-03-01 DIAGNOSIS — R519 Headache, unspecified: Secondary | ICD-10-CM

## 2014-03-01 DIAGNOSIS — R51 Headache: Secondary | ICD-10-CM | POA: Insufficient documentation

## 2014-03-01 MED ORDER — GADOBENATE DIMEGLUMINE 529 MG/ML IV SOLN
20.0000 mL | Freq: Once | INTRAVENOUS | Status: AC | PRN
  Administered 2014-03-01: 20 mL via INTRAVENOUS

## 2014-03-02 ENCOUNTER — Telehealth: Payer: Self-pay | Admitting: *Deleted

## 2014-03-02 NOTE — Telephone Encounter (Signed)
Patient returning your call in reference to his most recent MRI result. He has more questions he would like to ask

## 2014-03-02 NOTE — Telephone Encounter (Signed)
Message left for normal MRI results

## 2014-03-02 NOTE — Telephone Encounter (Signed)
Patient is aware of normal MRI 

## 2014-03-09 NOTE — Telephone Encounter (Signed)
See Susie's 03/02/14 telephone encounter (final documentation entered on separate phone encounter) / Sherri S.

## 2014-03-12 ENCOUNTER — Ambulatory Visit: Admitting: Gastroenterology

## 2014-05-18 ENCOUNTER — Ambulatory Visit: Admitting: Neurology

## 2014-05-18 DIAGNOSIS — Z029 Encounter for administrative examinations, unspecified: Secondary | ICD-10-CM

## 2014-05-20 ENCOUNTER — Encounter: Payer: Self-pay | Admitting: Neurology

## 2014-10-25 ENCOUNTER — Emergency Department (HOSPITAL_COMMUNITY)
Admission: EM | Admit: 2014-10-25 | Discharge: 2014-10-25 | Disposition: A | Discharge status: Home / Self Care | Payer: Managed Care, Other (non HMO) | Attending: Emergency Medicine | Admitting: Emergency Medicine

## 2014-10-25 ENCOUNTER — Encounter (HOSPITAL_COMMUNITY): Payer: Self-pay | Admitting: Emergency Medicine

## 2014-10-25 ENCOUNTER — Telehealth (HOSPITAL_COMMUNITY): Payer: Self-pay

## 2014-10-25 DIAGNOSIS — Z7952 Long term (current) use of systemic steroids: Secondary | ICD-10-CM | POA: Insufficient documentation

## 2014-10-25 DIAGNOSIS — Z792 Long term (current) use of antibiotics: Secondary | ICD-10-CM | POA: Diagnosis not present

## 2014-10-25 DIAGNOSIS — Z793 Long term (current) use of hormonal contraceptives: Secondary | ICD-10-CM | POA: Diagnosis not present

## 2014-10-25 DIAGNOSIS — Z7951 Long term (current) use of inhaled steroids: Secondary | ICD-10-CM | POA: Insufficient documentation

## 2014-10-25 DIAGNOSIS — Z79899 Other long term (current) drug therapy: Secondary | ICD-10-CM | POA: Insufficient documentation

## 2014-10-25 DIAGNOSIS — J029 Acute pharyngitis, unspecified: Secondary | ICD-10-CM

## 2014-10-25 DIAGNOSIS — R509 Fever, unspecified: Secondary | ICD-10-CM | POA: Insufficient documentation

## 2014-10-25 DIAGNOSIS — Z87891 Personal history of nicotine dependence: Secondary | ICD-10-CM | POA: Insufficient documentation

## 2014-10-25 LAB — RAPID STREP SCREEN (MED CTR MEBANE ONLY): Streptococcus, Group A Screen (Direct): POSITIVE — AB

## 2014-10-25 MED ORDER — DEXAMETHASONE SODIUM PHOSPHATE 10 MG/ML IJ SOLN
10.0000 mg | Freq: Once | INTRAMUSCULAR | Status: AC
  Administered 2014-10-25: 10 mg via INTRAMUSCULAR
  Filled 2014-10-25: qty 1

## 2014-10-25 MED ORDER — KETOROLAC TROMETHAMINE 60 MG/2ML IM SOLN
60.0000 mg | Freq: Once | INTRAMUSCULAR | Status: AC
  Administered 2014-10-25: 60 mg via INTRAMUSCULAR
  Filled 2014-10-25: qty 2

## 2014-10-25 MED ORDER — ACETAMINOPHEN 500 MG PO TABS
1000.0000 mg | ORAL_TABLET | Freq: Once | ORAL | Status: AC
  Administered 2014-10-25: 1000 mg via ORAL
  Filled 2014-10-25: qty 2

## 2014-10-25 NOTE — Discharge Instructions (Signed)
Pharyngitis Antonio Coleman, See a primary care doctor within 3 days for close follow-up. If symptoms worsen come back to the emergency department immediately. Thank you. Pharyngitis is a sore throat (pharynx). There is redness, pain, and swelling of your throat. HOME CARE   Drink enough fluids to keep your pee (urine) clear or pale yellow.  Only take medicine as told by your doctor.  You may get sick again if you do not take medicine as told. Finish your medicines, even if you start to feel better.  Do not take aspirin.  Rest.  Rinse your mouth (gargle) with salt water ( tsp of salt per 1 qt of water) every 1-2 hours. This will help the pain.  If you are not at risk for choking, you can suck on hard candy or sore throat lozenges. GET HELP IF:  You have large, tender lumps on your neck.  You have a rash.  You cough up green, yellow-brown, or bloody spit. GET HELP RIGHT AWAY IF:   You have a stiff neck.  You drool or cannot swallow liquids.  You throw up (vomit) or are not able to keep medicine or liquids down.  You have very bad pain that does not go away with medicine.  You have problems breathing (not from a stuffy nose). MAKE SURE YOU:   Understand these instructions.  Will watch your condition.  Will get help right away if you are not doing well or get worse. Document Released: 07/04/2007 Document Revised: 11/05/2012 Document Reviewed: 09/22/2012 [facility] Patient Information 2015 [place], [place]. This information is not intended to replace advice given to you by your health care provider. Make sure you discuss any questions you have with your health care provider.

## 2014-10-25 NOTE — ED Notes (Signed)
Patient here with complaint of sore throat and chills. Onset yesterday. States worse with swallowing. Concentrated on left side.

## 2014-10-25 NOTE — ED Provider Notes (Signed)
CSN: [id number]     Arrival date & time 10/25/14  0046 History   First MD Initiated Contact with Patient 10/25/14 0240     Chief Complaint  Patient presents with  . Sore Throat  . Fever     (Consider location/radiation/quality/duration/timing/severity/associated sxs/prior Treatment) HPI  Antonio Coleman. is a 34 y.o. male with no second past medical history presenting today with sore throat and fever. Patient states this began 2 days ago. He has worsening pain with swallowing. It is worse on the left side. He has sick contacts and his wife who have similar symptoms. He denies his children having any symptoms. Patient felt chills today and presents for symptomatic control. He does not try anything at home to help her symptoms. He has no further complaints.  10 Systems reviewed and are negative for acute change except as noted in the HPI.     Past Medical History  Diagnosis Date  . Allergy   . Headache    Past Surgical History  Procedure Laterality Date  . Wisdom tooth extraction     No family history on file. Social History  Substance Use Topics  . Smoking status: Former Smoker    Types: Cigars    Quit date: 02/02/2014  . Smokeless tobacco: None  . Alcohol Use: 0.0 oz/week    0 Standard drinks or equivalent per week     Comment: occassional    Review of Systems    Allergies  Shellfish allergy and Soy allergy  Home Medications   Prior to Admission medications   Medication Sig Start Date End Date Taking? Authorizing Provider  azithromycin (ZITHROMAX) 250 MG tablet  01/19/14   Historical Provider, MD  Bioflavonoid Products (VITAMIN C) CHEW Chew 1 tablet by mouth daily.    Historical Provider, MD  CARAFATE 1 GM/10ML suspension  12/23/13   Historical Provider, MD  cefdinir (OMNICEF) 300 MG capsule  01/14/14   Historical Provider, MD  chlorhexidine (PERIDEX) 0.12 % solution Use as directed 15 mLs in the mouth or throat 2 (two) times daily.    Historical Provider, MD   DENTA 5000 PLUS 1.1 % CREA dental cream  01/13/14   Historical Provider, MD  fluticasone (FLONASE) 50 MCG/ACT nasal spray Place 1 spray into both nostrils daily.    Historical Provider, MD  Multiple Vitamin (MULTIVITAMIN WITH MINERALS) TABS tablet Take 1 tablet by mouth daily.    Historical Provider, MD  omeprazole (PRILOSEC) 40 MG capsule Take 40 mg by mouth daily.    Historical Provider, MD  predniSONE (STERAPRED UNI-PAK) 10 MG tablet  01/14/14   Historical Provider, MD  testosterone (ANDRODERM) 4 MG/24HR PT24 patch Place 1 patch onto the skin daily.    Historical Provider, MD  testosterone (ANDRODERM) 5 MG/24HR Place 1 patch onto the skin daily.    Historical Provider, MD  triamcinolone cream (KENALOG) 0.1 %  02/03/14   Historical Provider, MD   BP 151/99 mmHg  Pulse 79  Temp(Src) 100.7 F (38.2 C) (Oral)  Resp 16  Ht  (1.905 m)  Wt 235 lb (106.595 kg)  BMI 29.37 kg/m2  SpO2 100% Physical Exam  Constitutional: He is oriented to person, place, and time. Vital signs are normal. He appears well-developed and well-nourished.  Non-toxic appearance. He does not appear ill. No distress.  HENT:  Head: Normocephalic and atraumatic.  Nose: Nose normal.  Mouth/Throat: Oropharynx is clear and moist. No oropharyngeal exudate.  Left tonsil is swollen with small exudates.  Eyes:  Conjunctivae and EOM are normal. Pupils are equal, round, and reactive to light. No scleral icterus.  Neck: Normal range of motion. Neck supple. No tracheal deviation, no edema, no erythema and normal range of motion present. No thyroid mass and no thyromegaly present.  Cardiovascular: Normal rate, regular rhythm, S1 normal, S2 normal, normal heart sounds, intact distal pulses and normal pulses.  Exam reveals no gallop and no friction rub.   No murmur heard. Pulses:      Radial pulses are 2+ on the right side, and 2+ on the left side.       Dorsalis pedis pulses are 2+ on the right side, and 2+ on the left side.   Pulmonary/Chest: Effort normal and breath sounds normal. No respiratory distress. He has no wheezes. He has no rhonchi. He has no rales.  Abdominal: Soft. Normal appearance and bowel sounds are normal. He exhibits no distension, no ascites and no mass. There is no hepatosplenomegaly. There is no tenderness. There is no rebound, no guarding and no CVA tenderness.  Musculoskeletal: Normal range of motion. He exhibits no edema or tenderness.  Lymphadenopathy:    He has no cervical adenopathy.  Neurological: He is alert and oriented to person, place, and time. He has normal strength. No cranial nerve deficit or sensory deficit.  Skin: Skin is warm, dry and intact. No petechiae and no rash noted. He is not diaphoretic. No erythema. No pallor.  Psychiatric: He has a normal mood and affect. His behavior is normal. Judgment normal.  Nursing note and vitals reviewed.   ED Course  Procedures (including critical care time) Labs Review Labs Reviewed  RAPID STREP SCREEN (NOT AT [facility]) - Abnormal; Notable for the following:    Streptococcus, Group A Screen (Direct) POSITIVE (*)    All other components within normal limits    Imaging Review No results found. I have personally reviewed and evaluated these images and lab results as part of my medical decision-making.   EKG Interpretation None      MDM   Final diagnoses:  None   patient presents to the emergency department for sore throat.  Statistically, this is more likely a viral pharyngitis. Patient was given Decadron and Toradol for symptomatic control. Strep test was not clinically indicated but ordered by triage. I do not believe antibody coverage is warranted. Patient is advised to follow with primary care within 3 days. He appears well in no acute distress, his vital signs remain within his normal limits and he is safe for discharge.   Tomasita Crumble, MD 10/25/14 [phone]

## 2014-10-25 NOTE — Telephone Encounter (Signed)
Pt calling to see if he was supposed to get antibiotic for his sore throat.  Per MD EPIC note no anitbiotic coverage needed , thought to be viral.  Pt informed.

## 2016-09-21 IMAGING — CT CT ANGIO CHEST
1 of 2 series · 19 of 32 positions shown · IV contrast ([ID] OMNI 350)
Comparison: None.

CLINICAL DATA: Chest pain which increases when sitting. Feels like
a bubble in his epigastric area. Elevated D-dimer. Recent diagnosis
of enlarged heart.

EXAM:
CT ANGIOGRAPHY CHEST WITH CONTRAST
TECHNIQUE: Multidetector CT imaging of the chest was performed using the
standard protocol during bolus administration of intravenous
contrast. Multiplanar CT image reconstructions and MIPs were
obtained to evaluate the vascular anatomy.
CONTRAST:  100mL OMNIPAQUE IOHEXOL 350 MG/ML SOLN

[Series 6: thins for pacs · axial · 0.73mm/px · z∈[+1493,+1737]mm · 19 of 272 slices shown]
[im 14/272  lung]
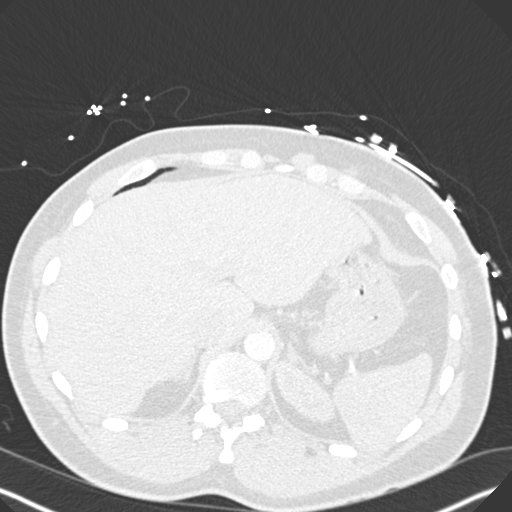
[im 28/272  mediastinal]
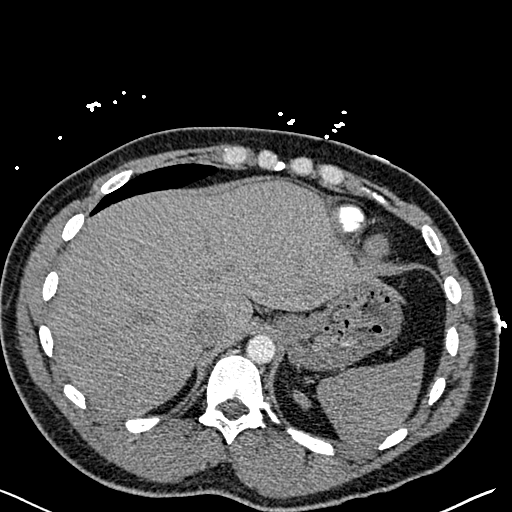
[im 41/272  lung]
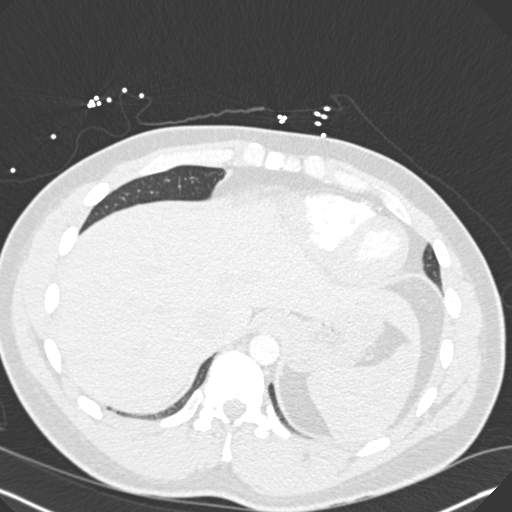
[im 68/272  mediastinal]
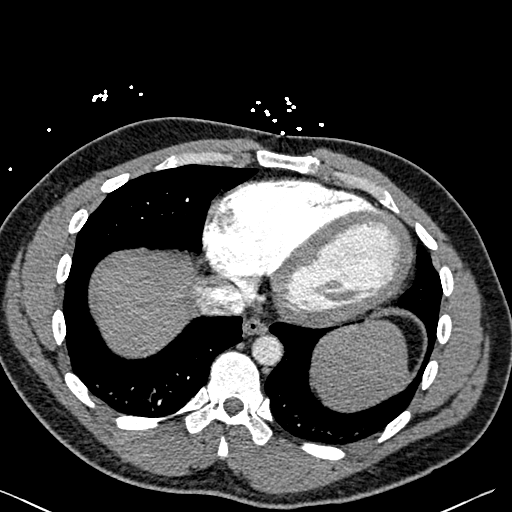
[im 82/272  lung]
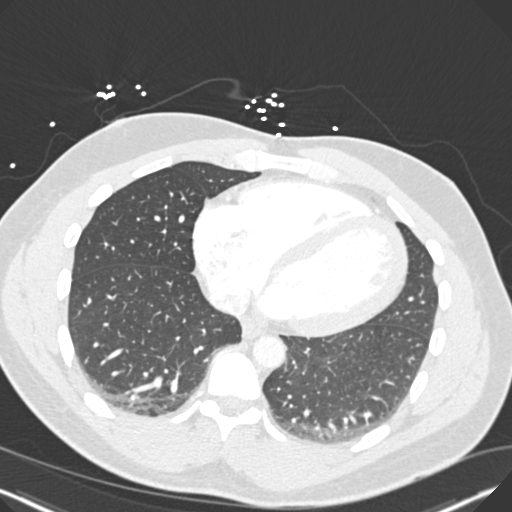
[im 91/272  mediastinal]
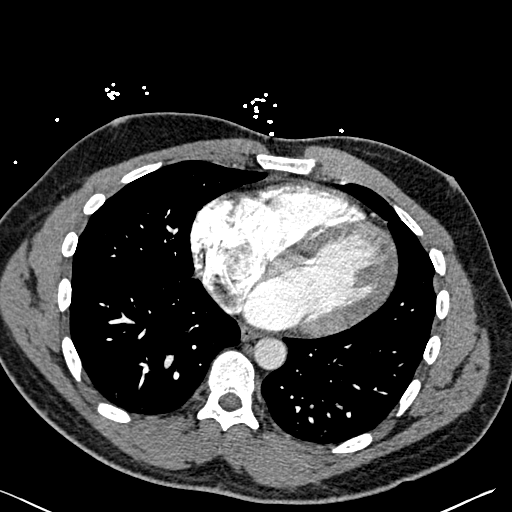
[im 95/272  lung]
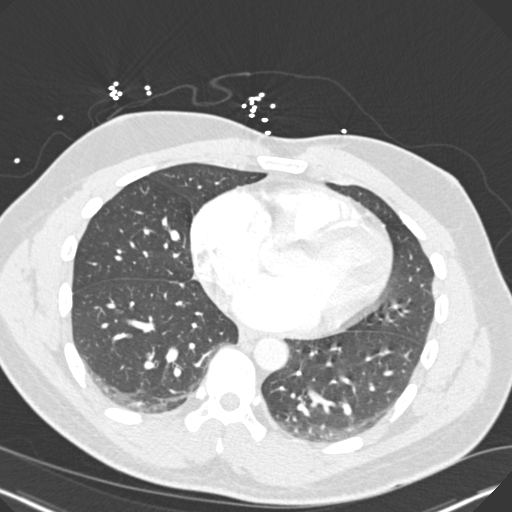
[im 109/272  mediastinal]
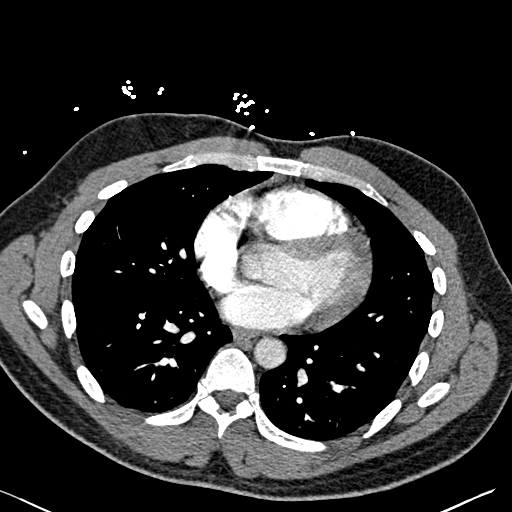
[im 122/272  lung]
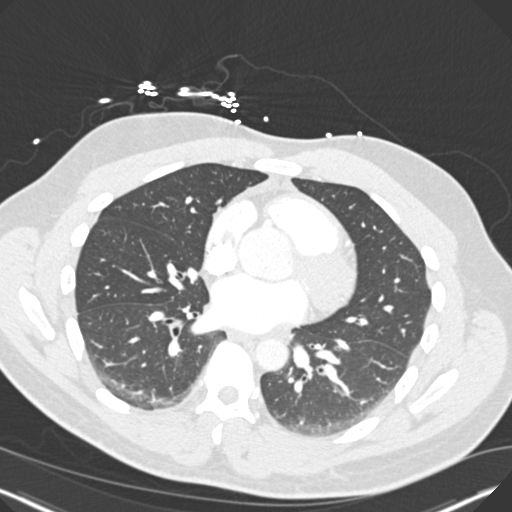
[im 136/272  mediastinal]
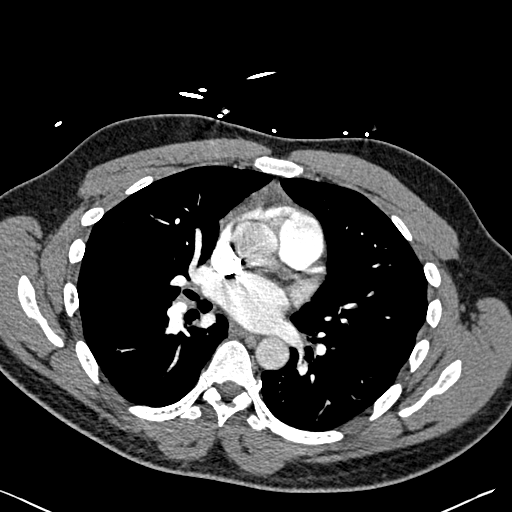
[im 150/272  lung]
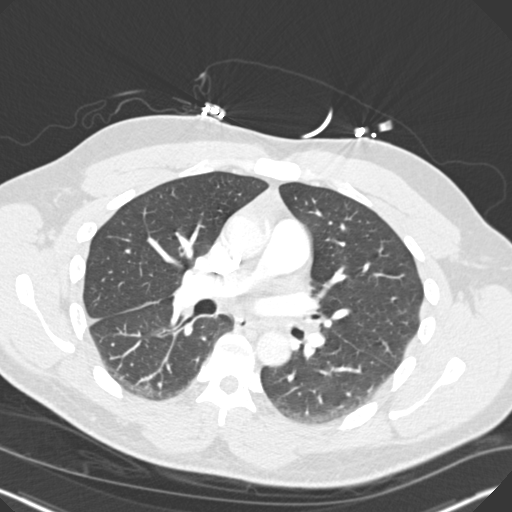
[im 163/272  mediastinal]
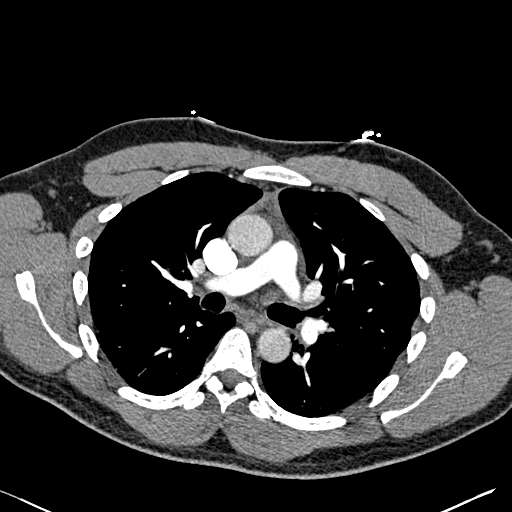
[im 177/272  lung]
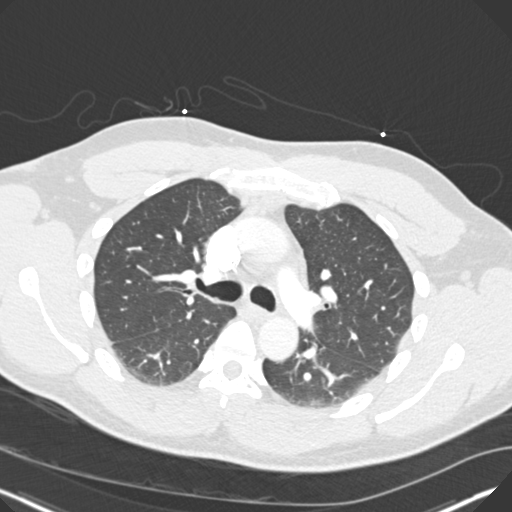
[im 181/272  mediastinal]
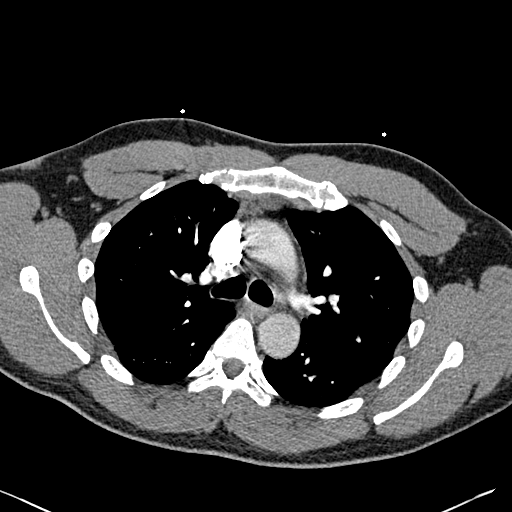
[im 190/272  lung]
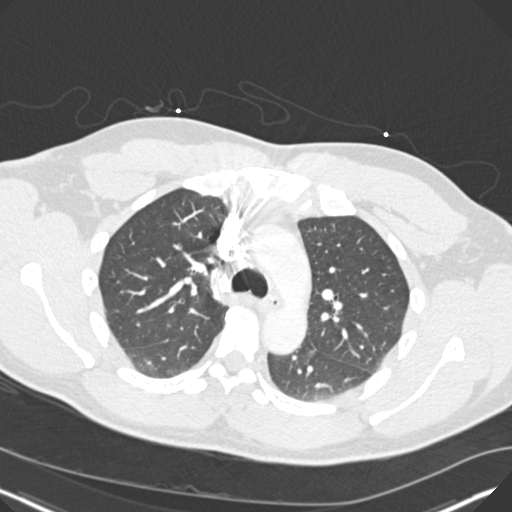
[im 204/272  mediastinal]
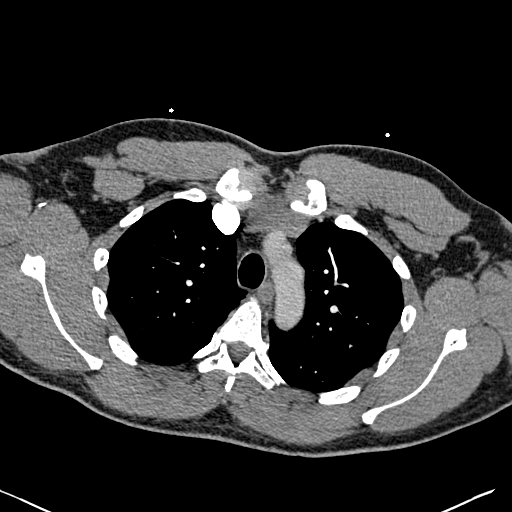
[im 231/272  lung]
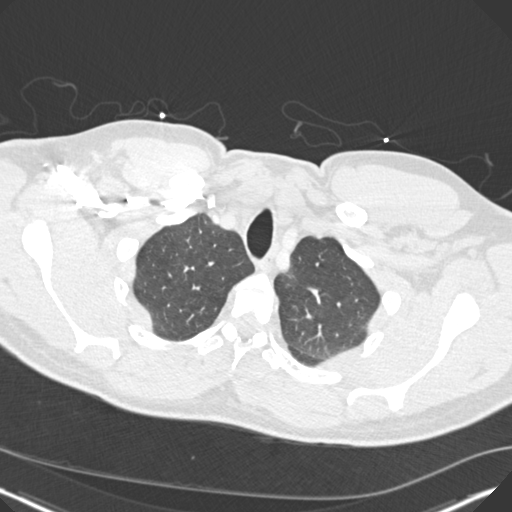
[im 244/272  mediastinal]
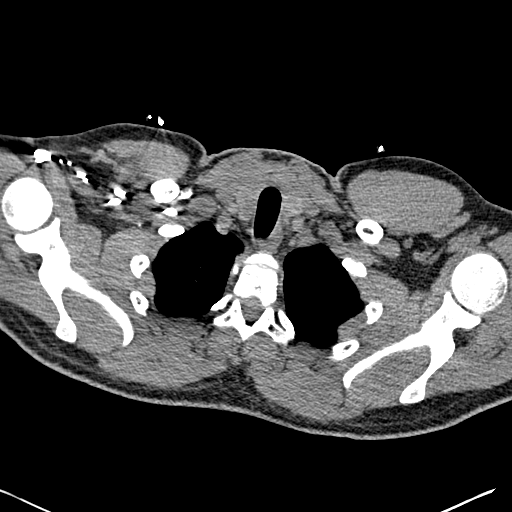
[im 258/272  lung]
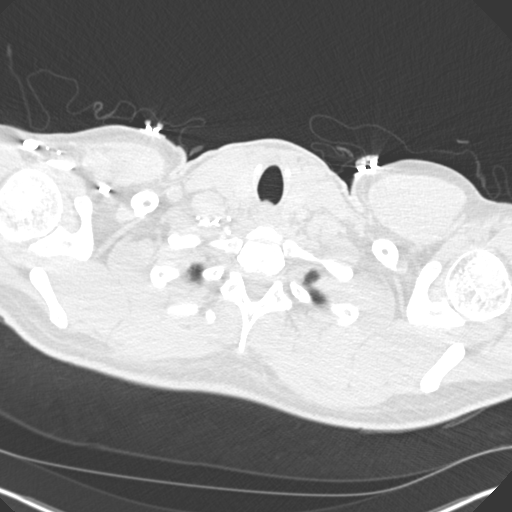

[19 of 32 positions shown; findings below may reference images not displayed]

FINDINGS: Technically adequate study with good opacification of the central
and segmental pulmonary arteries. No focal filling defects
demonstrated. No evidence of significant pulmonary embolus.

Normal heart size. Normal caliber thoracic aorta. Great vessel
origins are patent. Residual soft tissue in the anterior mediastinum
consistent with residual thymic tissue. No significant
lymphadenopathy in the chest. Esophagus is decompressed.

Dependent atelectasis in the lung bases. No focal airspace disease
or consolidation in the lungs. No pneumothorax. No pleural
effusions.

Visualized portions of upper abdomen are grossly unremarkable. No
destructive bone lesions appreciated.

Review of the MIP images confirms the above findings.
IMPRESSION: No evidence of significant pulmonary embolus.

## 2017-04-15 ENCOUNTER — Ambulatory Visit (HOSPITAL_COMMUNITY)
Admission: EM | Admit: 2017-04-15 | Discharge: 2017-04-15 | Disposition: A | Discharge status: Home / Self Care | Payer: Commercial Managed Care - PPO | Attending: Urgent Care | Admitting: Urgent Care

## 2017-04-15 ENCOUNTER — Encounter (HOSPITAL_COMMUNITY): Payer: Self-pay | Admitting: Family Medicine

## 2017-04-15 DIAGNOSIS — Z113 Encounter for screening for infections with a predominantly sexual mode of transmission: Secondary | ICD-10-CM | POA: Diagnosis present

## 2017-04-15 NOTE — ED Notes (Signed)
Dirty urine collected. 

## 2017-04-15 NOTE — ED Provider Notes (Signed)
  MRN: [id number] DOB: [birth year]  Subjective:   Beckey RutterSherman R Briel Jr. is a 37 y.o. male presenting for STI screen. Uses condoms for protection. Denies fever, dysuria, hematuria, urinary frequency, penile discharge, penile swelling, testicular pain, testicular swelling, anal pain, groin pain.   Lahoma RockerSherman is allergic to shellfish allergy and soy allergy.  Lahoma RockerSherman  has a past medical history of Allergy and Headache. Also  has a past surgical history that includes Wisdom tooth extraction.  Objective:   Vitals: BP 135/65   Pulse 77   Temp 98 F (36.7 C)   Resp 18   SpO2 100%   Physical Exam  Constitutional: He is oriented to person, place, and time. He appears well-developed and well-nourished.  Cardiovascular: Normal rate.  Pulmonary/Chest: Effort normal.  Neurological: He is alert and oriented to person, place, and time.  Psychiatric: He has a normal mood and affect.   Assessment and Plan :   Routine screening for STI (sexually transmitted infection)  Encouraged continued safe sex practices. Labs pending, will treat as appropriate.   Wallis BambergMani, Montanna Mcbain, PA-C 04/15/17 2200

## 2017-04-15 NOTE — ED Triage Notes (Signed)
Pt here for STD screening. He has no symptoms.

## 2017-04-16 LAB — HIV ANTIBODY (ROUTINE TESTING W REFLEX): HIV Screen 4th Generation wRfx: NONREACTIVE

## 2017-04-16 LAB — RPR: RPR Ser Ql: NONREACTIVE

## 2017-04-16 LAB — URINE CYTOLOGY ANCILLARY ONLY
Chlamydia: NEGATIVE
Neisseria Gonorrhea: NEGATIVE
Trichomonas: NEGATIVE

## 2019-01-07 ENCOUNTER — Ambulatory Visit (INDEPENDENT_AMBULATORY_CARE_PROVIDER_SITE_OTHER): Payer: Commercial Managed Care - PPO | Admitting: Pulmonary Disease

## 2019-01-07 ENCOUNTER — Other Ambulatory Visit: Payer: Self-pay

## 2019-01-07 ENCOUNTER — Encounter: Payer: Self-pay | Admitting: Pulmonary Disease

## 2019-01-07 DIAGNOSIS — G4733 Obstructive sleep apnea (adult) (pediatric): Secondary | ICD-10-CM | POA: Diagnosis not present

## 2019-01-07 NOTE — Addendum Note (Signed)
Addended by: Nena Polio on: 01/07/2019 10:17 AM   Modules accepted: Orders

## 2019-01-07 NOTE — Patient Instructions (Addendum)
Schedule home sleep test Discussed treatment options OK To take melatonin 5 mg 1 hour before bedtime

## 2019-01-07 NOTE — Progress Notes (Signed)
Subjective:    Patient ID: Antonio Coleman., male    DOB: 1981-01-27, 38 y.o.   MRN: [id number]  HPI  Chief Complaint  Patient presents with  . Consult    Snoring, Always tired when he wakes. Does not get a full night of sleep.   38 year old man presents for evaluation of sleep disordered breathing. He reports an erratic sleep pattern, nonrefreshing sleep and loud snoring. Epworth sleepiness score is 9 and he reports sleepiness while taking a break from work and lying down to rest in the afternoons. 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  He has to drink 2 to 3 cups of coffee to get through his workday. Bedtime is around 9:30 PM but sleep latency is long from 45 minutes to 2 hours which he attributes to his prior shift work, he prefers to lie on his left side with 1 pillow, reports 2-3 nocturnal awakenings including nocturia and is out of bed at 6:30 AM feeling tired with dryness of mouth but denies headaches. He is now divorced but his ex-wife had reported loud snoring and witnessed apneas.  He is also recorded himself and seen apneas. There is no history suggestive of cataplexy, sleep paralysis or parasomnias  He has gained 12 pounds since his sleep study to his current weight of 247 pounds. I reviewed his prior sleep study in detail as listed below  Family history-his father has OSA  Significant tests/ events reviewed  NPSG 12/2013 - wt 2 3 5  pounds -AHI 0.6/hour, predominant R ERA, RDI 11/hour -desensitized with medium full facemask  Past Medical History:  Diagnosis Date  . Allergy   . Headache     Past Surgical History:  Procedure Laterality Date  . WISDOM TOOTH EXTRACTION      Allergies  Allergen Reactions  . Shellfish Allergy     Hives   . Soy Allergy Hives    Soy sauce   Social History   Socioeconomic History  . Marital status: Married    Spouse name: Not on file   . Number of children: Not on file  . Years of education: Not on file  . Highest education level: Not on file  Occupational History  . Not on file  Social Needs  . Financial resource strain: Not on file  . Food insecurity    Worry: Not on file    Inability: Not on file  . Transportation needs    Medical: Not on file    Non-medical: Not on file  Tobacco Use  . Smoking status: Former Smoker    Years: 2.00    Types: Cigars  . Smokeless tobacco: Never Used  Substance and Sexual Activity  . Alcohol use: Yes    Alcohol/week: 0.0 standard drinks    Comment: occassional  . Drug use: No  . Sexual activity: Yes    Partners: Female    Birth control/protection: None  Lifestyle  . Physical activity    Days per week: Not on file    Minutes per session: Not on file  . Stress: Not on file  Relationships  . Social on phone: Not on file    Gets together: Not on file    Attends religious service: Not on file    Active member of club or organization: Not on file    Attends meetings of  clubs or organizations: Not on file    Relationship status: Not on file  . Intimate partner violence    Fear of current or ex partner: Not on file    Emotionally abused: Not on file    Physically abused: Not on file    Forced sexual activity: Not on file  Other Topics Concern  . Not on file  Social History Narrative  . Not on file     History reviewed. No pertinent family history.   Review of Systems Constitutional: negative for anorexia, fevers and sweats  Eyes: negative for irritation, redness and visual disturbance  Ears, nose, mouth, throat, and face: negative for earaches, epistaxis and sore throat positive for seasonal nasal congestion Respiratory: negative for cough, dyspnea on exertion, sputum and wheezing  Cardiovascular: negative for chest pain, dyspnea, lower extremity edema, orthopnea, palpitations and syncope  Gastrointestinal: negative for abdominal pain,  constipation, diarrhea, melena, nausea and vomiting  Genitourinary:negative for dysuria, frequency and hematuria  Hematologic/lymphatic: negative for bleeding, easy bruising and lymphadenopathy  Musculoskeletal:negative for arthralgias, muscle weakness and stiff joints  Neurological: negative for coordination problems, gait problems, headaches and weakness  Endocrine: negative for diabetic symptoms including polydipsia, polyuria and weight loss     Objective:   Physical Exam  Gen. Pleasant, obese, in no distress, normal affect ENT - no pallor,icterus, no post nasal drip, class 2-3 airway, long uvula, normal bite Neck: No JVD, no thyromegaly, no carotid bruits, neck circumference 18 " Lungs: no use of accessory muscles, no dullness to percussion, decreased without rales or rhonchi  Cardiovascular: Rhythm regular, heart sounds  normal, no murmurs or gallops, no peripheral edema Abdomen: soft and non-tender, no hepatosplenomegaly, BS normal. Musculoskeletal: No deformities, no cyanosis or clubbing Neuro:  alert, non focal, no tremors       Assessment & Plan:

## 2019-01-07 NOTE — Assessment & Plan Note (Addendum)
Given excessive daytime somnolence, narrow pharyngeal exam, witnessed apneas & loud snoring, obstructive sleep apnea is very likely & an overnight polysomnogram will be scheduled as a home study. The pathophysiology of obstructive sleep apnea , it's cardiovascular consequences & modes of treatment including CPAP were discused with the patient in detail & they evidenced understanding.  His study in 2015 showed mild OSA/upper airway resistance syndrome with a high RDI and low AHI.  He has gained more than 10 pounds since then so it is possible that his sleep apnea has worsened. He is willing to trial CPAP if needed  He also has some degree of shiftwork sleep disorder and have advised him to take melatonin 1 hour before bedtime

## 2019-03-30 ENCOUNTER — Telehealth: Payer: Self-pay | Admitting: Pulmonary Disease

## 2019-03-30 DIAGNOSIS — G4733 Obstructive sleep apnea (adult) (pediatric): Secondary | ICD-10-CM

## 2019-03-30 NOTE — Telephone Encounter (Signed)
I finally was able to tell the patient what it would cost to do the home sleep study per Marisue Ivan $775.00. I don't know if that price includes the doctors reading the study. The patient stated that if we are now doing in lab sleep study his insurance would pay more of the in lab.  Would Dr. Vassie Loll order the in lab sleep study for the patient. I told him that he would have to do the Covid test before doing the in lab sleep study

## 2019-03-30 NOTE — Telephone Encounter (Signed)
OK for in lab study

## 2019-03-30 NOTE — Telephone Encounter (Signed)
RA please advise if ok to order in-lab study for insurance cost. Thanks!

## 2019-03-31 NOTE — Telephone Encounter (Signed)
Order placed for in lab sleep study. 

## 2019-04-07 NOTE — Telephone Encounter (Signed)
In lab sleep study scheduled on 04/18/2019

## 2019-04-16 ENCOUNTER — Other Ambulatory Visit (HOSPITAL_COMMUNITY): Payer: Commercial Managed Care - PPO

## 2019-04-18 ENCOUNTER — Encounter (HOSPITAL_BASED_OUTPATIENT_CLINIC_OR_DEPARTMENT_OTHER): Payer: Commercial Managed Care - PPO | Admitting: Pulmonary Disease

## 2019-09-16 ENCOUNTER — Ambulatory Visit: Payer: Self-pay

## 2020-02-03 ENCOUNTER — Other Ambulatory Visit (HOSPITAL_BASED_OUTPATIENT_CLINIC_OR_DEPARTMENT_OTHER): Payer: Self-pay

## 2020-02-03 DIAGNOSIS — R400 Somnolence: Secondary | ICD-10-CM

## 2020-02-03 DIAGNOSIS — R0683 Snoring: Secondary | ICD-10-CM

## 2020-02-29 ENCOUNTER — Ambulatory Visit (HOSPITAL_BASED_OUTPATIENT_CLINIC_OR_DEPARTMENT_OTHER): Payer: Commercial Managed Care - PPO | Attending: Physician Assistant

## 2020-02-29 ENCOUNTER — Other Ambulatory Visit: Payer: Self-pay

## 2020-02-29 ENCOUNTER — Encounter (HOSPITAL_BASED_OUTPATIENT_CLINIC_OR_DEPARTMENT_OTHER): Payer: Self-pay

## 2020-02-29 DIAGNOSIS — R400 Somnolence: Secondary | ICD-10-CM

## 2020-02-29 DIAGNOSIS — R0683 Snoring: Secondary | ICD-10-CM

## 2020-04-21 ENCOUNTER — Ambulatory Visit: Payer: Commercial Managed Care - PPO | Admitting: Pulmonary Disease

## 2020-04-29 ENCOUNTER — Encounter (HOSPITAL_BASED_OUTPATIENT_CLINIC_OR_DEPARTMENT_OTHER): Payer: Self-pay

## 2020-04-29 DIAGNOSIS — R0683 Snoring: Secondary | ICD-10-CM

## 2020-06-02 ENCOUNTER — Encounter: Payer: Self-pay | Admitting: Neurology

## 2020-06-03 DIAGNOSIS — M545 Low back pain, unspecified: Secondary | ICD-10-CM | POA: Insufficient documentation

## 2020-06-16 ENCOUNTER — Other Ambulatory Visit: Payer: Self-pay

## 2020-06-16 ENCOUNTER — Ambulatory Visit (HOSPITAL_BASED_OUTPATIENT_CLINIC_OR_DEPARTMENT_OTHER): Payer: Commercial Managed Care - PPO | Attending: Internal Medicine | Admitting: Internal Medicine

## 2020-06-16 DIAGNOSIS — G4733 Obstructive sleep apnea (adult) (pediatric): Secondary | ICD-10-CM | POA: Insufficient documentation

## 2020-06-16 DIAGNOSIS — R0683 Snoring: Secondary | ICD-10-CM | POA: Diagnosis present

## 2020-06-18 DIAGNOSIS — R0683 Snoring: Secondary | ICD-10-CM

## 2020-06-18 NOTE — Procedures (Signed)
   Patient Name: Antonio Coleman, Antonio Coleman Date: 06/16/2020 Gender: Male D.O.B: 1980/04/24 Age (years): 39 Referring Provider: Amada Kingfisher Bonsu Height (inches): 75 Interpreting Physician: Jetty Duhamel MD, ABSM Weight (lbs): 245 RPSGT: Lowry Ram BMI: 31 MRN: [id number] Neck Size: 17.50  CLINICAL INFORMATION Sleep Study Type: NPSG Indication for sleep study: Fatigue, Hypertension, Morning Headaches, Snoring, Witnesses Apnea / Gasping During Sleep Epworth Sleepiness Score: 17  SLEEP STUDY TECHNIQUE As per the AASM Manual for the Scoring of Sleep and Associated Events v2.3 (April 2016) with a hypopnea requiring 4% desaturations.  The channels recorded and monitored were frontal, central and occipital EEG, electrooculogram (EOG), submentalis EMG (chin), nasal and oral airflow, thoracic and abdominal wall motion, anterior tibialis EMG, snore microphone, electrocardiogram, and pulse oximetry.  MEDICATIONS Medications self-administered by patient taken the night of the study : Melatonin  SLEEP ARCHITECTURE The study was initiated at 9:56:29 PM and ended at 4:25:25 AM.  Sleep onset time was 4.3 minutes and the sleep efficiency was 79.6%%. The total sleep time was 309.5 minutes.  Stage REM latency was 120.5 minutes.  The patient spent 2.9%% of the night in stage N1 sleep, 58.8%% in stage N2 sleep, 18.6%% in stage N3 and 19.7% in REM.  Alpha intrusion was absent.  Supine sleep was 100.00%.  RESPIRATORY PARAMETERS The overall apnea/hypopnea index (AHI) was 19.8 per hour. There were 1 total apneas, including 1 obstructive, 0 central and 0 mixed apneas. There were 101 hypopneas and 19 RERAs.  The AHI during Stage REM sleep was 45.2 per hour.  AHI while supine was 19.8 per hour.  The mean oxygen saturation was 94.5%. The minimum SpO2 during sleep was 80.0%.  moderate snoring was noted during this study.  CARDIAC DATA The 2 lead EKG demonstrated sinus rhythm. The mean heart  rate was 65.4 beats per minute. Other EKG findings include: None.  LEG MOVEMENT DATA The total PLMS were 0 with a resulting PLMS index of 0.0. Associated arousal with leg movement index was 2.3 .  IMPRESSIONS - Moderate obstructive sleep apnea occurred during this study (AHI = 19.8/h). - Oxygen desaturation was noted during this study (Min O2 = 80.0%). Mean O2 saturation 94.8%. - The patient snored with moderate snoring volume. - No cardiac abnormalities were noted during this study. - Clinically significant periodic limb movements did not occur during sleep. No significant associated arousals.  DIAGNOSIS - Obstructive Sleep Apnea (G47.33)  RECOMMENDATIONS - Suggest CPAP titration sleep study or autopap. Management consultation or other options would be based on clinical judgment., - Positional therapy avoiding supine position during sleep. - Be careful with alcohol, sedatives and other CNS depressants that may worsen sleep apnea and disrupt normal sleep architecture. - Sleep hygiene should be reviewed to assess factors that may improve sleep quality. - Weight management and regular exercise should be initiated or continued if appropriate.  [Electronically signed] 06/18/2020 11:51 AM  Jetty Duhamel MD, ABSM Diplomate, American Board of Sleep Medicine   NPI: [id number]                        Jetty Duhamel Diplomate, American Board of Sleep Medicine  ELECTRONICALLY SIGNED ON:  06/18/2020, 11:47 AM [facility] PH: [phone]   FX: [phone] ACCREDITED BY THE AMERICAN ACADEMY OF SLEEP MEDICINE

## 2020-06-29 ENCOUNTER — Encounter (HOSPITAL_BASED_OUTPATIENT_CLINIC_OR_DEPARTMENT_OTHER): Payer: Self-pay

## 2020-06-29 DIAGNOSIS — G4733 Obstructive sleep apnea (adult) (pediatric): Secondary | ICD-10-CM

## 2020-07-05 ENCOUNTER — Ambulatory Visit: Payer: Self-pay

## 2020-07-05 ENCOUNTER — Ambulatory Visit (INDEPENDENT_AMBULATORY_CARE_PROVIDER_SITE_OTHER): Payer: Commercial Managed Care - PPO | Admitting: Family Medicine

## 2020-07-05 ENCOUNTER — Other Ambulatory Visit: Payer: Self-pay

## 2020-07-05 DIAGNOSIS — M542 Cervicalgia: Secondary | ICD-10-CM

## 2020-07-05 DIAGNOSIS — M25552 Pain in left hip: Secondary | ICD-10-CM

## 2020-07-05 MED ORDER — BACLOFEN 10 MG PO TABS: 5.0000 mg | ORAL_TABLET | Freq: Three times a day (TID) | ORAL | 3 refills | Status: AC | PRN

## 2020-07-05 MED ORDER — CELECOXIB 200 MG PO CAPS: 200.0000 mg | ORAL_CAPSULE | Freq: Two times a day (BID) | ORAL | 6 refills | Status: AC | PRN
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
## 2020-07-13 ENCOUNTER — Ambulatory Visit: Payer: Commercial Managed Care - PPO | Admitting: Rehabilitative and Restorative Service Providers"

## 2020-07-13 ENCOUNTER — Encounter (HOSPITAL_COMMUNITY): Payer: Self-pay

## 2020-07-13 ENCOUNTER — Emergency Department (HOSPITAL_COMMUNITY)
Admission: EM | Admit: 2020-07-13 | Discharge: 2020-07-13 | Disposition: A | Discharge status: Home / Self Care | Payer: Commercial Managed Care - PPO | Attending: Emergency Medicine | Admitting: Emergency Medicine

## 2020-07-13 DIAGNOSIS — Z5321 Procedure and treatment not carried out due to patient leaving prior to being seen by health care provider: Secondary | ICD-10-CM | POA: Insufficient documentation

## 2020-07-13 DIAGNOSIS — G43909 Migraine, unspecified, not intractable, without status migrainosus: Secondary | ICD-10-CM | POA: Insufficient documentation

## 2020-07-13 NOTE — ED Notes (Signed)
Pt states he does not want to wait and will see his neurologist in the morning.

## 2020-07-13 NOTE — ED Triage Notes (Signed)
Pt c/o migraine getting worse over the last day. States he has had migraines since getting a concussion in April. Was prescribed something by neurologist yesterday but unable to pick up prescription yet. Reports light sensitivity

## 2020-08-15 ENCOUNTER — Ambulatory Visit: Payer: Commercial Managed Care - PPO | Admitting: Neurology

## 2020-08-16 ENCOUNTER — Other Ambulatory Visit: Payer: Self-pay

## 2020-08-16 ENCOUNTER — Ambulatory Visit (HOSPITAL_BASED_OUTPATIENT_CLINIC_OR_DEPARTMENT_OTHER): Payer: Commercial Managed Care - PPO | Attending: Internal Medicine | Admitting: Internal Medicine

## 2020-08-16 VITALS — Ht 76.0 in | Wt 274.0 lb

## 2020-08-16 DIAGNOSIS — G4731 Primary central sleep apnea: Secondary | ICD-10-CM | POA: Insufficient documentation

## 2020-08-16 DIAGNOSIS — G4733 Obstructive sleep apnea (adult) (pediatric): Secondary | ICD-10-CM

## 2020-08-16 DIAGNOSIS — Z9189 Other specified personal risk factors, not elsewhere classified: Secondary | ICD-10-CM | POA: Diagnosis present

## 2020-08-20 DIAGNOSIS — G4733 Obstructive sleep apnea (adult) (pediatric): Secondary | ICD-10-CM

## 2020-08-20 NOTE — Procedures (Signed)
Patient Name: Antonio, Coleman Date: 08/16/2020 Gender: Male D.O.B: 02-18-1980 Age (years): 39 Referring Provider: Amada Kingfisher Bonsu Height (inches): 76 Interpreting Physician: Jetty Duhamel MD, ABSM Weight (lbs): 274 RPSGT: Armen Pickup BMI: 33 MRN: [id number] Neck Size: 17.50  CLINICAL INFORMATION The patient is referred for a CPAP titration to treat sleep apnea. Date of NPSG, Split Night or HST:    NPSG 06/16/20  AHI 19.8/ hr, desaturation to 80%, body weight 245 lbs  SLEEP STUDY TECHNIQUE As per the AASM Manual for the Scoring of Sleep and Associated Events v2.3 (April 2016) with a hypopnea requiring 4% desaturations.  The channels recorded and monitored were frontal, central and occipital EEG, electrooculogram (EOG), submentalis EMG (chin), nasal and oral airflow, thoracic and abdominal wall motion, anterior tibialis EMG, snore microphone, electrocardiogram, and pulse oximetry. Continuous positive airway pressure (CPAP) was initiated at the beginning of the study and titrated to treat sleep-disordered breathing.  MEDICATIONS Medications self-administered by patient taken the night of the study : none reported  TECHNICIAN COMMENTS Comments added by technician: Patient requested to end study early due to discomfort with equipment. Patient had difficulty initiating sleep. Patient was restless all through the night. NO RESTROOM VISTED Comments added by scorer: N/A  RESPIRATORY PARAMETERS Optimal PAP Pressure (cm): 12 AHI at Optimal Pressure (/hr): 54.8 Overall Minimal O2 (%): 88.0 Supine % at Optimal Pressure (%): 100 Minimal O2 at Optimal Pressure (%): 90.0   SLEEP ARCHITECTURE The study was initiated at 10:16:02 PM and ended at 4:15:57 AM.  Sleep onset time was 5.5 minutes and the sleep efficiency was 20.1%%. The total sleep time was 72.5 minutes.  The patient spent 34.5%% of the night in stage N1 sleep, 65.5%% in stage N2 sleep, 0.0%% in stage N3 and 0% in  REM.Stage REM latency was N/A minutes  Wake after sleep onset was 281.9. Alpha intrusion was absent. Supine sleep was 100.00%.  CARDIAC DATA The 2 lead EKG demonstrated sinus rhythm. The mean heart rate was 59.7 beats per minute. Other EKG findings include: PVCs.  LEG MOVEMENT DATA The total Periodic Limb Movements of Sleep (PLMS) were 0. The PLMS index was 0.0. A PLMS index of <15 is considered normal in adults.  IMPRESSIONS - The optimal PAP pressure was 12 cm of water. - Titration was limited by patient's inability to maintain sleep- only slept about 1 hour after 11:00 PM. - Moderate Central Sleep Apnea was noted during this titration (CAI = 20.7/h). - Mild oxygen desaturations were observed during this titration (min O2 = 88.0%). Minimum O2 saturation on CPAP 12 was 90%. - No snoring was audible during this study. - 2-lead EKG demonstrated: PVCs - Clinically significant periodic limb movements were not noted during this study.   DIAGNOSIS - Obstructive Sleep Apnea (G47.33) - Central Sleep Apnea  RECOMMENDATIONS - Trial of CPAP therapy on 12 cm H2O or autopap 5-15.  - Patient used a Large size Philips Respironics Full Face Mask Amara View mask and heated humidification. - Consider adding a sedative-hypnotic initially. - Patient may need help adjusting to CPAP. Management consultation is available if desired. - Sleep hygiene should be reviewed to assess factors that may improve sleep quality. - Weight management and regular exercise should be initiated or continued. - Return to Sleep Center for re-evaluation after 4 weeks of therapy  [Electronically signed] 08/20/2020 01:13 PM  Jetty Duhamel MD, ABSM Diplomate, American Board of Sleep Medicine   NPI: [id number]  Jetty Duhamel Diplomate, Biomedical engineer of Sleep Medicine  ELECTRONICALLY SIGNED ON:  08/20/2020, 1:06 PM [facility] PH: [phone]   FX:  [phone] ACCREDITED BY THE AMERICAN ACADEMY OF SLEEP MEDICINE

## 2020-08-23 ENCOUNTER — Telehealth: Payer: Self-pay | Admitting: Internal Medicine

## 2020-08-23 DIAGNOSIS — G4733 Obstructive sleep apnea (adult) (pediatric): Secondary | ICD-10-CM

## 2020-08-23 NOTE — Telephone Encounter (Signed)
Pt stated that he is wanting to get a referral for him to obtain a sleep study machine stated that his PCP is unable to do the form because they do not know the pressures and Dr. Maple Hudson did his CPAP titration on 08/16/2020. Pt has established care with Dr. Vassie Loll last OV was 12/2018. Pls regard; [phone]

## 2020-08-23 NOTE — Telephone Encounter (Signed)
Order- new DME, new CPAP auto 5-15, mask of choice, humidifier, supplies, AirView/ card  He will need appointment to see me or Dr Vassie Loll in 31-90 days after getting machine, per insurance regs.

## 2020-08-23 NOTE — Telephone Encounter (Signed)
I called and spoke with patient regarding CPAP machine. I have sent in the order with the given pressure per Dr. Maple Hudson. Patient verbalized understanding and asked about the process of getting the machine. I went through it and also informed that when patient receives machine to call the office and schedule a f/u within 30-90 days of getting machine. Patient verbalized understanding, nothing further needed.

## 2020-09-02 ENCOUNTER — Encounter (HOSPITAL_BASED_OUTPATIENT_CLINIC_OR_DEPARTMENT_OTHER): Payer: Commercial Managed Care - PPO | Admitting: Internal Medicine

## 2020-11-02 ENCOUNTER — Telehealth: Payer: Self-pay | Admitting: Internal Medicine

## 2020-11-03 NOTE — Telephone Encounter (Signed)
Received call from [facility] with [employer], he states that the patient was last called on 8/4 and told that they had a shortage of machine. The order has been processed and he is in line for a machine.  Nida Boatman stated he will send a message for the patient to be contacted with an update and hopefully they will have a machine available soon.  Called and spoke with patient, relayed information from [place] with [employer].  The patient was asking about the authorization being cancelled because of not having a f/u visit.  I let the patient know that he does have to have an OV 31-90 days after receiving his CPAP machine and using it, but that does not start until he actually receives the machine and starts using it.  I provided him with the # for [employer] [facility].  Nothing further needed.

## 2020-11-03 NOTE — Telephone Encounter (Signed)
Called and spoke with patient, he states a CPAP was ordered for him back in July and was told it would be 6-8 weeks and he has still not received it.  He called his insurance company and was told that the authorization had been removed.  Advised patient I would look into this for him and then one of [place] would call him back once we had an update.  He verified understanding.  Called and spoke with [facility] with [employer] regarding CPAP machine provided patient information and issue.  His computer locked up, he stated he would restart his computer, pull up the patient and call me back.  Will await return call from Michie.

## 2021-01-15 ENCOUNTER — Other Ambulatory Visit: Payer: Self-pay

## 2021-01-15 ENCOUNTER — Emergency Department (HOSPITAL_BASED_OUTPATIENT_CLINIC_OR_DEPARTMENT_OTHER)
Admission: EM | Admit: 2021-01-15 | Discharge: 2021-01-16 | Disposition: A | Discharge status: Home / Self Care | Payer: Commercial Managed Care - PPO | Attending: Emergency Medicine | Admitting: Emergency Medicine

## 2021-01-15 ENCOUNTER — Encounter (HOSPITAL_BASED_OUTPATIENT_CLINIC_OR_DEPARTMENT_OTHER): Payer: Self-pay

## 2021-01-15 DIAGNOSIS — J020 Streptococcal pharyngitis: Secondary | ICD-10-CM | POA: Diagnosis not present

## 2021-01-15 DIAGNOSIS — F17290 Nicotine dependence, other tobacco product, uncomplicated: Secondary | ICD-10-CM | POA: Insufficient documentation

## 2021-01-15 DIAGNOSIS — Z20822 Contact with and (suspected) exposure to covid-19: Secondary | ICD-10-CM | POA: Insufficient documentation

## 2021-01-15 DIAGNOSIS — J029 Acute pharyngitis, unspecified: Secondary | ICD-10-CM | POA: Diagnosis present

## 2021-01-15 LAB — RESP PANEL BY RT-PCR (FLU A&B, COVID) ARPGX2
Influenza A by PCR: NEGATIVE
Influenza B by PCR: NEGATIVE
SARS Coronavirus 2 by RT PCR: NEGATIVE

## 2021-01-15 LAB — GROUP A STREP BY PCR: Group A Strep by PCR: DETECTED — AB

## 2021-01-15 MED ORDER — ACETAMINOPHEN 325 MG PO TABS
650.0000 mg | ORAL_TABLET | Freq: Once | ORAL | Status: AC | PRN
  Administered 2021-01-15: 22:00:00 650 mg via ORAL
  Filled 2021-01-15: qty 2

## 2021-01-15 MED ORDER — HYDROCODONE-ACETAMINOPHEN 5-325 MG PO TABS
1.0000 | ORAL_TABLET | Freq: Once | ORAL | Status: AC
  Administered 2021-01-15: 23:00:00 1 via ORAL
  Filled 2021-01-15: qty 1

## 2021-01-15 MED ORDER — PENICILLIN G BENZATHINE 1200000 UNIT/2ML IM SUSY
1.2000 10*6.[IU] | PREFILLED_SYRINGE | Freq: Once | INTRAMUSCULAR | Status: AC
  Administered 2021-01-15: 23:00:00 1.2 10*6.[IU] via INTRAMUSCULAR
  Filled 2021-01-15: qty 2

## 2021-01-15 MED ORDER — DEXAMETHASONE SODIUM PHOSPHATE 10 MG/ML IJ SOLN
10.0000 mg | Freq: Once | INTRAMUSCULAR | Status: AC
  Administered 2021-01-15: 23:00:00 10 mg via INTRAMUSCULAR
  Filled 2021-01-15: qty 1

## 2021-01-15 NOTE — ED Triage Notes (Signed)
Pt c/o sore throat and swollen tonsils. Pt states he is spitting up a lot of mucous and has a bad taste in his mouth. Pt also reports subjective fever.

## 2021-01-16 MED ORDER — IBUPROFEN 600 MG PO TABS: 600.0000 mg | ORAL_TABLET | Freq: Four times a day (QID) | ORAL | 0 refills | Status: AC | PRN

## 2021-01-16 NOTE — Discharge Instructions (Signed)
You were seen today and found to have strep throat.  Continue Tylenol or ibuprofen as needed for pain and/or fever.  Steroids will take effect within the next 12 hours.  You are given a dose of Bicillin.  If you have new or worsening symptoms, you should be reevaluated.

## 2021-01-16 NOTE — ED Notes (Signed)
Pt verbalizes understanding of discharge instructions. Opportunity for questioning and answers were provided. Pt discharged from ED to home.   ? ?

## 2021-01-16 NOTE — ED Provider Notes (Signed)
[facility] EMERGENCY DEPT Provider Note   CSN: [id number] Arrival date & time: 01/15/21  2159     History Chief Complaint  Patient presents with   Sore Throat    Antonio Coleman. is a 40 y.o. male.  HPI     This is a 40 year old male with no reported past medical history who presents with sore throat and fever.  Onset of symptoms on Friday.  Primarily sore throat.  Difficulty swallowing and pain with swallowing.  Reports he has been taking ibuprofen at home with minimal relief.  Has been coughing up "mucus" from the back of his throat.  Also reports a bad taste in his mouth.  History of tonsillitis.  Patient reports chills at home without documented fever.  No known sick contacts.  Past Medical History:  Diagnosis Date   Allergy    Headache     Patient Active Problem List   Diagnosis Date Noted   Snoring 06/16/2020   Lumbar pain 06/03/2020   OSA (obstructive sleep apnea) 01/07/2019    Past Surgical History:  Procedure Laterality Date   ROTATOR CUFF REPAIR Left    WISDOM TOOTH EXTRACTION         No family history on file.  Social History   Tobacco Use   Smoking status: Some Days    Types: Cigars   Smokeless tobacco: Never  Vaping Use   Vaping Use: Never used  Substance Use Topics   Alcohol use: Yes    Alcohol/week: 0.0 standard drinks    Comment: occassional   Drug use: No    Home Medications Prior to Admission medications   Medication Sig Start Date End Date Taking? Authorizing Provider  ibuprofen (ADVIL) 600 MG tablet Take 1 tablet (600 mg total) by mouth every 6 (six) hours as needed. 01/16/21  Yes Shakeia Krus, Mayer Masker, MD  amitriptyline (ELAVIL) 25 MG tablet Take 25 mg by mouth at bedtime. 07/05/20   [provider]  amLODipine-olmesartan (AZOR) 10-40 MG tablet Take 1 tablet by mouth daily. 06/22/20   [provider]  baclofen (LIORESAL) 10 MG tablet Take 0.5-1 tablets (5-10 mg total) by mouth 3 (three) times daily  as needed for muscle spasms. 07/05/20   Hilts, Casimiro Needle, MD  Bioflavonoid Products (VITAMIN C) CHEW Chew 1 tablet by mouth daily.    [provider]  celecoxib (CELEBREX) 200 MG capsule Take 1 capsule (200 mg total) by mouth 2 (two) times daily as needed. 07/05/20   Hilts, Casimiro Needle, MD  meloxicam (MOBIC) 15 MG tablet Take 1 tablet by mouth daily. 06/21/20   [provider]  Multiple Vitamin (MULTIVITAMIN WITH MINERALS) TABS tablet Take 1 tablet by mouth daily.    [provider]  SUMAtriptan (IMITREX) 50 MG tablet sumatriptan 50 mg tablet  TAKE 1 TAB(S) ORALLY 3 TIMES A DAY, AS NEEDED    [provider]  tiZANidine (ZANAFLEX) 4 MG tablet Take 4 mg by mouth every 8 (eight) hours as needed. Patient not taking: Reported on 07/05/2020 05/25/20   [provider]    Allergies    Shellfish allergy and Soy allergy  Review of Systems   Review of Systems  Constitutional:  Positive for chills and fever.  HENT:  Positive for sore throat and trouble swallowing.   Respiratory:  Negative for shortness of breath.   Cardiovascular:  Negative for chest pain.  All other systems reviewed and are negative.  Physical Exam Updated Vital Signs BP 110/66    Pulse Marland Kitchen)  102    Temp (!) 100.9 F (38.3 C) (Oral)    Resp 18    Ht 1.956 m (6\' 5" )    Wt 131.5 kg    SpO2 97%    BMI 34.39 kg/m   Physical Exam Vitals and nursing note reviewed.  Constitutional:      Appearance: He is well-developed. He is obese. He is not ill-appearing.  HENT:     Head: Normocephalic and atraumatic.     Nose: No congestion.     Mouth/Throat:     Comments: Posterior oropharynx with 2+ tonsillar enlargement bilaterally and symmetric, uvula midline, tonsillar exudate noted with palatal petechiae, no trismus, no fullness under the tongue Eyes:     Pupils: Pupils are equal, round, and reactive to light.  Cardiovascular:     Rate and Rhythm: Normal rate and regular rhythm.     Heart sounds: Normal  heart sounds. No murmur heard. Pulmonary:     Effort: Pulmonary effort is normal. No respiratory distress.     Breath sounds: Normal breath sounds. No wheezing.  Abdominal:     Palpations: Abdomen is soft.     Tenderness: There is no abdominal tenderness. There is no rebound.  Musculoskeletal:     Cervical back: Neck supple.  Lymphadenopathy:     Cervical: Cervical adenopathy present.  Skin:    General: Skin is warm and dry.  Neurological:     Mental Status: He is alert and oriented to person, place, and time.  Psychiatric:        Mood and Affect: Mood normal.    ED Results / Procedures / Treatments   Labs (all labs ordered are listed, but only abnormal results are displayed) Labs Reviewed  GROUP A STREP BY PCR - Abnormal; Notable for the following components:      Result Value   Group A Strep by PCR DETECTED (*)    All other components within normal limits  RESP PANEL BY RT-PCR (FLU A&B, COVID) [id number]    EKG None  Radiology No results found.  Procedures Procedures   Medications Ordered in ED Medications  acetaminophen (TYLENOL) tablet 650 mg (650 mg Oral Given 01/15/21 2227)  penicillin g benzathine (BICILLIN LA) 1200000 UNIT/2ML injection 1.2 Million Units (1.2 Million Units Intramuscular Given 01/15/21 2312)  dexamethasone (DECADRON) injection 10 mg (10 mg Intramuscular Given 01/15/21 2312)  HYDROcodone-acetaminophen (NORCO/VICODIN) 5-325 MG per tablet 1 tablet (1 tablet Oral Given 01/15/21 2312)    ED Course  I have reviewed the triage vital signs and the nursing notes.  Pertinent labs & imaging results that were available during my care of the patient were reviewed by me and considered in my medical decision making (see chart for details).    MDM Rules/Calculators/A&P                          Patient presents with sore throat.  Reports chills.  Temperature here one 1.9.  Otherwise vital signs notable for heart rate 120.  Likely related to temperature.  On  exam he has tonsillar exudate palatal petechiae.  He has Centor criteria 3 of 4.  Strep and COVID/influenza screening sent.  Strep screening is positive.  Patient was given IM Decadron and IM Bicillin.  He was also given a dose of Norco.  He has no evidence at this time suggestive of deep space infection such as PTA.  Will discharge home with ibuprofen every 6 hours.  Patient was  given return precautions.  After history, exam, and medical workup I feel the patient has been appropriately medically screened and is safe for discharge home. Pertinent diagnoses were discussed with the patient. Patient was given return precautions.     Final Clinical Impression(s) / ED Diagnoses Final diagnoses:  Strep pharyngitis    Rx / DC Orders ED Discharge Orders          Ordered    ibuprofen (ADVIL) 600 MG tablet  Every 6 hours PRN        01/16/21 0048             Shon Baton, MD 01/16/21 [phone]

## 2021-02-11 ENCOUNTER — Encounter: Payer: Self-pay | Admitting: Internal Medicine

## 2021-03-25 NOTE — Progress Notes (Deleted)
03/28/21- 26 yoM establishing for OSA. Saw Dr Elsworth Soho initially in 2020 Medical problem list includes OSA, Lumbar Pain, Headache,  NPSG 06/16/20- AHI 19.8/ hr, desaturation to 80%, body weight 245 lbs CPAP autp 5-15/ [facility] ordered 08/23/20 Epworth score- Body weight today- Covid vax- Flu vax-

## 2021-03-28 ENCOUNTER — Ambulatory Visit: Payer: Commercial Managed Care - PPO | Admitting: Internal Medicine

## 2021-04-03 ENCOUNTER — Ambulatory Visit (INDEPENDENT_AMBULATORY_CARE_PROVIDER_SITE_OTHER): Payer: Commercial Managed Care - PPO | Admitting: Adult Health

## 2021-04-03 ENCOUNTER — Other Ambulatory Visit: Payer: Self-pay

## 2021-04-03 ENCOUNTER — Encounter: Payer: Self-pay | Admitting: Adult Health

## 2021-04-03 VITALS — BP 122/88 | HR 92 | Temp 98.5°F | Ht 76.0 in | Wt 293.2 lb

## 2021-04-03 DIAGNOSIS — G4733 Obstructive sleep apnea (adult) (pediatric): Secondary | ICD-10-CM | POA: Diagnosis not present

## 2021-04-03 NOTE — Progress Notes (Signed)
? ?@Patient  ID: ., male    DOB: 1980/07/14, 41 y.o.   MRN: [id number] ? ?Chief Complaint  ?Patient presents with  ? Follow-up  ? ? ?Referring provider: ?[id number], MD ? ?HPI: ?41 year old male followed for obstructive sleep apnea ? ?TEST/EVENTS :  ?In lab sleep study Jun 16, 2020 shows moderate obstructive sleep apnea with AHI at 19.8/hour and SPO2 low at 80% ? ?CPAP titration study August 16, 2020 showed optimal control on 12 cm H2O.,  Moderate central sleep apnea noted 20.7/hour. ? ?04/03/2021 Follow up: OSA  ?Patient returns for follow up.  Patient was last seen December 2020.  At that time was set up for sleep study .  In lab study Jun 16, 2020 showed showed moderate sleep apnea with a AHI at 19.8/hour and SPO2 low at 80.  Patient was set up for CPAP titration study that showed optimal control at 12 cm H2O.  There was moderate central sleep apnea noted with 20.7/hour. ?Patient returns today for a follow-up.  Patient says he has been trying to get used to his CPAP.  He has trouble wearing it for any more than 1 to 2 hours.  Patient says as soon as he puts on the mask he feels anxiety feels like that he cannot get out of the air.  Patient feels it he will wear it for short period of time things will be fine and then all of a sudden he feels like he cannot breathe.  Feels like the mask is suffocating him.  He also has trouble falling asleep when he wears his CPAP.  Patient complains of daytime sleepiness and fatigue.  Patient says his sleep is restless on CPAP. ?Patient has no history of congestive heart failure or stroke.  Patient does take amitriptyline at bedtime.  Is not on any pain medicines.  Occasionally uses a muscle relaxer but rarely .  ?CPAP download shows minimum use.  A few days patient got in 1 to 2 hours.  AHI was 10.7.  CAI was 7.3/hour ? ? ?Allergies  ?Allergen Reactions  ? Shellfish Allergy   ?  Hives   ? Soy Allergy Hives  ?  Soy sauce  ? ? ?Immunization History   ?Administered Date(s) Administered  ? Hep A / Hep B 07/11/2001, 08/09/2001  ? Hepatitis A, Adult 05/02/2002, 01/15/2009  ? Hepatitis B, adult 04/04/2002  ? IPV 07/04/2001  ? Influenza Inj Mdck Quad Pf 12/15/2018  ? Influenza Nasal 12/30/2005, 12/19/2008, 10/09/2009  ? Influenza Whole 07/04/2001, 11/29/2001, 02/06/2003  ? Influenza-Unspecified 01/02/2004, 02/04/2005  ? Meningococcal Polysaccharide 07/04/2001  ? Td 07/04/2001  ? Typhoid Inactivated 04/04/2002, 05/06/2004  ? ? ?Past Medical History:  ?Diagnosis Date  ? Allergy   ? Headache   ? ? ?Tobacco History: ?Social History  ? ?Tobacco Use  ?Smoking Status Some Days  ? Types: Cigars  ?Smokeless Tobacco Never  ? ?Ready to quit: Not Answered ?Counseling given: Not Answered ? ? ?Outpatient Medications Prior to Visit  ?Medication Sig Dispense Refill  ? amitriptyline (ELAVIL) 25 MG tablet Take 25 mg by mouth at bedtime.    ? amLODipine-olmesartan (AZOR) 10-40 MG tablet Take 1 tablet by mouth daily.    ? baclofen (LIORESAL) 10 MG tablet Take 0.5-1 tablets (5-10 mg total) by mouth 3 (three) times daily as needed for muscle spasms. 30 each 3  ? Bioflavonoid Products (VITAMIN C) CHEW Chew 1 tablet by mouth daily.    ? celecoxib (CELEBREX) 200 MG capsule Take  1 capsule (200 mg total) by mouth 2 (two) times daily as needed. 60 capsule 6  ? ibuprofen (ADVIL) 600 MG tablet Take 1 tablet (600 mg total) by mouth every 6 (six) hours as needed. 30 tablet 0  ? meloxicam (MOBIC) 15 MG tablet Take 1 tablet by mouth daily.    ? Multiple Vitamin (MULTIVITAMIN WITH MINERALS) TABS tablet Take 1 tablet by mouth daily.    ? SUMAtriptan (IMITREX) 50 MG tablet sumatriptan 50 mg tablet ? TAKE 1 TAB(S) ORALLY 3 TIMES A DAY, AS NEEDED    ? tiZANidine (ZANAFLEX) 4 MG tablet Take 4 mg by mouth every 8 (eight) hours as needed.    ? ?No facility-administered medications prior to visit.  ? ? ? ?Review of Systems:  ? ?Constitutional:   No  weight loss, night sweats,  Fevers, chills,  ?+fatigue, or   lassitude. ? ?HEENT:   No headaches,  Difficulty swallowing,  Tooth/dental problems, or  Sore throat,  ?              No sneezing, itching, ear ache, nasal congestion, post nasal drip,  ? ?CV:  No chest pain,  Orthopnea, PND, swelling in lower extremities, anasarca, dizziness, palpitations, syncope.  ? ?GI  No heartburn, indigestion, abdominal pain, nausea, vomiting, diarrhea, change in bowel habits, loss of appetite, bloody stools.  ? ?Resp: No shortness of breath with exertion or at rest.  No excess mucus, no productive cough,  No non-productive cough,  No coughing up of blood.  No change in color of mucus.  No wheezing.  No chest wall deformity ? ?Skin: no rash or lesions. ? ?GU: no dysuria, change in color of urine, no urgency or frequency.  No flank pain, no hematuria  ? ?MS:  No joint pain or swelling.  No decreased range of motion.  No back pain. ? ? ? ?Physical Exam ? ?BP 122/88 (BP Location: Left Arm, Patient Position: Sitting, Cuff Size: Large)   Pulse 92   Temp 98.5 ?F (36.9 ?C) (Oral)   Ht 6\' 4"  (1.93 m)   Wt 293 lb 3.2 oz (133 kg)   SpO2 98%   BMI 35.69 kg/m?  ? ?GEN: A/Ox3; pleasant , NAD, well nourished , BMI 35  ?  ?HEENT:  [place]/AT,  EACs-clear, TMs-wnl, NOSE-clear, THROAT-clear, no lesions, no postnasal drip or exudate noted. Class 3 MP airway  ? ?NECK:  Supple w/ fair ROM; no JVD; normal carotid impulses w/o bruits; no thyromegaly or nodules palpated; no lymphadenopathy.   ? ?RESP  Clear  P & A; w/o, wheezes/ rales/ or rhonchi. no accessory muscle use, no dullness to percussion ? ?CARD:  RRR, no m/r/g, no peripheral edema, pulses intact, no cyanosis or clubbing. ? ?GI:   Soft & nt; nml bowel sounds; no organomegaly or masses detected.  ? ?Musco: Warm bil, no deformities or joint swelling noted.  ? ?Neuro: alert, no focal deficits noted.   ? ?Skin: Warm, no lesions or rashes ? ? ? ?Lab Results: ? ?CBC ? ? ? ? ?BNP ?No results found for: BNP ? ?ProBNP ?No results found for: PROBNP ? ?Imaging: ?No  results found. ? ? ? ?No flowsheet data found. ? ?No results found for: NITRICOXIDE ? ? ? ? ? ?Assessment & Plan:  ? ?OSA (obstructive sleep apnea) ?Moderate obstructive sleep apnea-patient is having difficulty tolerating mask.  Is causing some anxiety.  We will change pressure settings.  And also changed to a DreamWear nasal mask which may  be more comfortable for patient. ?Compliance is encouraged.  We will need to watch for CPAP emergent central sleep apnea ? ? ?Plan  ?Patient Instructions  ?Try to wear your CPAP every single night.  It is important to wear at least 6 hours or more. ?Change  CPAP pressure to 8 to 16 cm H2O. ?Healthy sleep regimen ?Do not drive if sleepy ?Change to Dream wear nasal mask.  ?Melatonin At bedtime As needed  for insomnia .  ?Work on healthy weight loss ?Follow-up with Dr. Vassie Loll in 3 months and as needed ? ?  ? ? ? ?Morbid obesity (HCC) ?Healthy weight loss  ? ? ? ? ?Rubye Oaks, NP ?04/03/2021 ? ?

## 2021-04-03 NOTE — Patient Instructions (Addendum)
Try to wear your CPAP every single night.  It is important to wear at least 6 hours or more. ?Change  CPAP pressure to 8 to 16 cm H2O. ?Healthy sleep regimen ?Do not drive if sleepy ?Change to Dream wear nasal mask.  ?Melatonin At bedtime As needed  for insomnia .  ?Work on healthy weight loss ?Follow-up with Dr. Vassie Loll in 3 months and as needed ? ?

## 2021-04-03 NOTE — Assessment & Plan Note (Signed)
Moderate obstructive sleep apnea-patient is having difficulty tolerating mask.  Is causing some anxiety.  We will change pressure settings.  And also changed to a DreamWear nasal mask which may be more comfortable for patient. ?Compliance is encouraged.  We will need to watch for CPAP emergent central sleep apnea ? ? ?Plan  ?Patient Instructions  ?Try to wear your CPAP every single night.  It is important to wear at least 6 hours or more. ?Change  CPAP pressure to 8 to 16 cm H2O. ?Healthy sleep regimen ?Do not drive if sleepy ?Change to Dream wear nasal mask.  ?Melatonin At bedtime As needed  for insomnia .  ?Work on healthy weight loss ?Follow-up with Dr. Vassie Loll in 3 months and as needed ? ?  ? ? ?

## 2021-04-03 NOTE — Assessment & Plan Note (Signed)
Healthy weight loss 

## 2021-04-07 ENCOUNTER — Telehealth: Payer: Self-pay | Admitting: Adult Health

## 2021-04-07 NOTE — Telephone Encounter (Signed)
Called and spoke with patient. He stated that he had reached to [facility] for his cpap mask but they stated that they did not have an order. He had called the 1-800 number. I explained to him that there has been a disconnect between the main [facility] office and local [facility] office and that we received a confirmation on 04/04/21 that his order had been received.  ? ?I supplied him with the local [facility] number.  ? ?Nothing further needed at time of call.  ?

## 2021-09-20 NOTE — Progress Notes (Signed)
HPI: M Cigar Smoker followed for OSA, complicated by  TEST/EVENTS :  NPSG Jun 16, 2020 shows moderate obstructive sleep apnea with AHI at 19.8/hour and SPO2 low at 80%, body weight 245 lbs CPAP titration study August 16, 2020 showed optimal control on 12 cm H2O.,  Moderate central sleep apnea noted 20.7/hour, body weight 274 lbs  ======================================================================  04/03/2021 OV Parrett, NP- Follow up: OSA  "Patient returns for follow up.  Patient was last seen December 2020.  At that time was set up for sleep study .  In lab study Jun 16, 2020 showed showed moderate sleep apnea with a AHI at 19.8/hour and SPO2 low at 80.  Patient was set up for CPAP titration study that showed optimal control at 12 cm H2O.  There was moderate central sleep apnea noted with 20.7/hour. Patient returns today for a follow-up.  Patient says he has been trying to get used to his CPAP.  He has trouble wearing it for any more than 1 to 2 hours.  Patient says as soon as he puts on the mask he feels anxiety feels like that he cannot get out of the air.  Patient feels it he will wear it for short period of time things will be fine and then all of a sudden he feels like he cannot breathe.  Feels like the mask is suffocating him.  He also has trouble falling asleep when he wears his CPAP.  Patient complains of daytime sleepiness and fatigue.  Patient says his sleep is restless on CPAP. Patient has no history of congestive heart failure or stroke.  Patient does take amitriptyline at bedtime.  Is not on any pain medicines.  Occasionally uses a muscle relaxer but rarely .  CPAP download shows minimum use.  A few days patient got in 1 to 2 hours.  AHI was 10.7.  CAI was 7.3/hour OSA- Moderate obstructive sleep apnea-patient is having difficulty tolerating mask.  Is causing some anxiety.  We will change pressure settings.  And also changed to a DreamWear nasal mask which may be more comfortable for  patient. Compliance is encouraged.  We will need to watch for CPAP emergent central sleep apnea Plan  Patient Instructions  Try to wear your CPAP every single night.  It is important to wear at least 6 hours or more. Change  CPAP pressure to 8 to 16 cm H2O. Healthy sleep regimen Do not drive if sleepy Change to Dream wear nasal mask.  Melatonin At bedtime As needed  for insomnia .  Work on healthy weight loss Follow-up with Dr. Vassie Loll in 3 months and as needed" ............................................................Marland Kitchen  09/22/21- 41 yoM Cigar Smoker following for OSA, complicated by Morbid Obesity, Lumbar Pain, Headache,  CPAP auto 8-16/ [facility]   [place] [facility])    Download compliance- SD card is blank Body weight today- 293 lbs LOV 04/03/21 Parrett, NP emphasized compliance, ordered pressure change to 8-16, melatonin, DreamWear mask.  ------Pt f/u OSA feel like the pressure is messing up, the machine with just stop working and he removes the machine, this is a nightly occurrence. He reports his CPAP machine shuts off almost as soon as he puts it on his face. Bothersome postnasal drainage with a history of allergic rhinitis for which he has received allergy shots in the past.  He can breathe comfortably through his nose.  Aware of septal deviation.  Interested in allergy referral.  Has tried saline rinse, Dymista nasal spray, Flonase. We discussed alternatives  to CPAP, including an oral appliance and he agrees to refer referral to learn more about this.  I discussed obstructive versus central sleep apnea. He is prepared to work on his weight.  ROS-see HPI   + = positive Constitutional:    weight loss, night sweats, fevers, chills, fatigue, lassitude. HEENT:    headaches, difficulty swallowing, tooth/dental problems, sore throat,       sneezing, itching, ear ache, nasal congestion, +post nasal drip, snoring CV:    chest pain, orthopnea, PND, swelling in lower extremities, anasarca,                                   dizziness, palpitations Resp:   shortness of breath with exertion or at rest.                productive cough,   non-productive cough, coughing up of blood.              change in color of mucus.  wheezing.   Skin:    rash or lesions. GI:  No-   heartburn, indigestion, abdominal pain, nausea, vomiting, diarrhea,                 change in bowel habits, loss of appetite GU: dysuria, change in color of urine, no urgency or frequency.   flank pain. MS:   joint pain, stiffness, decreased range of motion, back pain. Neuro-     nothing unusual Psych:  change in mood or affect.  depression or anxiety.   memory loss.  OBJ- Physical Exam General- Alert, Oriented, Affect-appropriate, Distress- none acute, + tall, +obese Skin- rash-none, lesions- none, excoriation- none Lymphadenopathy- none Head- atraumatic            Eyes- Gross vision intact, PERRLA, conjunctivae and secretions clear            Ears- Hearing, canals-normal            Nose- Clear, no-Septal dev, mucus, polyps, erosion, perforation             Throat- Mallampati II , mucosa clear , drainage- none, tonsils- atrophic Neck- flexible , trachea midline, no stridor , thyroid nl, carotid no bruit Chest - symmetrical excursion , unlabored           Heart/CV- RRR , no murmur , no gallop  , no rub, nl s1 s2                           - JVD- none , edema- none, stasis changes- none, varices- none           Lung- clear to P&A, wheeze- none, cough- none , dullness-none, rub- none           Chest wall-  Abd-  Br/ Gen/ Rectal- Not done, not indicated Extrem- cyanosis- none, clubbing, none, atrophy- none, strength- nl Neuro- grossly intact to observation

## 2021-09-22 ENCOUNTER — Ambulatory Visit (INDEPENDENT_AMBULATORY_CARE_PROVIDER_SITE_OTHER): Payer: Commercial Managed Care - PPO | Admitting: Internal Medicine

## 2021-09-22 ENCOUNTER — Encounter: Payer: Self-pay | Admitting: Internal Medicine

## 2021-09-22 VITALS — BP 130/80 | HR 77 | Ht 77.0 in | Wt 293.0 lb

## 2021-09-22 DIAGNOSIS — G4733 Obstructive sleep apnea (adult) (pediatric): Secondary | ICD-10-CM

## 2021-09-22 DIAGNOSIS — J309 Allergic rhinitis, unspecified: Secondary | ICD-10-CM

## 2021-09-22 DIAGNOSIS — J3089 Other allergic rhinitis: Secondary | ICD-10-CM | POA: Insufficient documentation

## 2021-09-22 DIAGNOSIS — J302 Other seasonal allergic rhinitis: Secondary | ICD-10-CM

## 2021-09-22 NOTE — Assessment & Plan Note (Signed)
Referral to assess complaint of postnasal drainage with history of allergy vaccine for allergic rhinitis in the past.

## 2021-09-22 NOTE — Assessment & Plan Note (Signed)
I discussed the physiology of sleep apnea, compared obstructive to central, and reviewed treatment options. Plan-DME is to service his machine with his report that he keeps cutting off.  Referral back to sleep center to repeat mask fitting/desensitization and to see in person what happens when he puts his machine on.  Referral to consider oral appliance option.

## 2021-09-22 NOTE — Assessment & Plan Note (Signed)
If correct, he has gained nearly 50 pounds in the past year.  He expresses motivation to bring this down.

## 2021-09-22 NOTE — Patient Instructions (Signed)
Order- DME [facility]- please service CPAP machine- cutting off inappropriately  Order- referral to sleep center for Mask Fitting and Desensitization (take your machine to this)  Order- referral to Dr Althea Grimmer, Orthodontist     consider oral appliance for OSA  Order- referral to [facility] ([address]

## 2021-10-03 NOTE — Progress Notes (Unsigned)
New Patient Note  RE: Antonio Coleman. MRN: [id number] DOB: 06/05/1980 Date of Office Visit: 10/04/2021  Consult requested by: Waymon Budge, MD Primary care provider: Jackie Plum, MD  Chief Complaint: No chief complaint on file.  History of Present Illness: I had the pleasure of seeing Antonio Coleman for initial evaluation at the [facility] on 10/03/2021. He is a 41 y.o. male, who is referred here by Jackie Plum, MD for the evaluation of allergic rhinitis.  He reports symptoms of ***. Symptoms have been going on for *** years. The symptoms are present *** all year around with worsening in ***. Other triggers include exposure to ***. Anosmia: ***. Headache: ***. He has used *** with ***fair improvement in symptoms. Sinus infections: ***. Previous work up includes: ***. Previous ENT evaluation: ***. Previous sinus imaging: ***. History of nasal polyps: ***. Last eye exam: ***. History of reflux: ***.  09/22/2021 pulmonology visit: "Bothersome postnasal drainage with a history of allergic rhinitis for which he has received allergy shots in the past.  He can breathe comfortably through his nose.  Aware of septal deviation.  Interested in allergy referral.  Has tried saline rinse, Dymista nasal spray, Flonase."  Assessment and Plan: Antonio Coleman is a 41 y.o. male with: No problem-specific Assessment & Plan notes found for this encounter.  No follow-ups on file.  No orders of the defined types were placed in this encounter.  Lab Orders  No laboratory test(s) ordered today    Other allergy screening: Asthma: {Blank single:19197::"yes","no"} Rhino conjunctivitis: {Blank single:19197::"yes","no"} Food allergy: {Blank single:19197::"yes","no"} Medication allergy: {Blank single:19197::"yes","no"} Hymenoptera allergy: {Blank single:19197::"yes","no"} Urticaria: {Blank single:19197::"yes","no"} Eczema:{Blank single:19197::"yes","no"} History of  recurrent infections suggestive of immunodeficency: {Blank single:19197::"yes","no"}  Diagnostics: Spirometry:  Tracings reviewed. His effort: {Blank single:19197::"Good reproducible efforts.","It was hard to get consistent efforts and there is a question as to whether this reflects a maximal maneuver.","Poor effort, data can not be interpreted."} FVC: ***L FEV1: ***L, ***% predicted FEV1/FVC ratio: ***% Interpretation: {Blank single:19197::"Spirometry consistent with mild obstructive disease","Spirometry consistent with moderate obstructive disease","Spirometry consistent with severe obstructive disease","Spirometry consistent with possible restrictive disease","Spirometry consistent with mixed obstructive and restrictive disease","Spirometry uninterpretable due to technique","Spirometry consistent with normal pattern","No overt abnormalities noted given today's efforts"}.  Please see scanned spirometry results for details.  Skin Testing: {Blank single:19197::"Select foods","Environmental allergy panel","Environmental allergy panel and select foods","Food allergy panel","None","Deferred due to recent antihistamines use"}. *** Results discussed with patient/family.   Past Medical History: Patient Active Problem List   Diagnosis Date Noted   Seasonal and perennial allergic rhinitis    Morbid obesity (HCC) 04/03/2021   Snoring 06/16/2020   Lumbar pain 06/03/2020   OSA (obstructive sleep apnea) 01/07/2019   Past Medical History:  Diagnosis Date   Allergy    Headache    Past Surgical History: Past Surgical History:  Procedure Laterality Date   ROTATOR CUFF REPAIR Left    WISDOM TOOTH EXTRACTION     Medication List:  Current Outpatient Medications  Medication Sig Dispense Refill   ALPRAZolam (XANAX) 0.25 MG tablet Take 0.25 mg by mouth daily.     amitriptyline (ELAVIL) 25 MG tablet Take 25 mg by mouth at bedtime. (Patient not taking: Reported on 09/22/2021)      amLODipine-olmesartan (AZOR) 10-40 MG tablet Take 1 tablet by mouth daily.     baclofen (LIORESAL) 10 MG tablet Take 0.5-1 tablets (5-10 mg total) by mouth 3 (three) times daily as needed for muscle spasms. (Patient not taking:  Reported on 09/22/2021) 30 each 3   Bioflavonoid Products (VITAMIN C) CHEW Chew 1 tablet by mouth daily.     celecoxib (CELEBREX) 200 MG capsule Take 1 capsule (200 mg total) by mouth 2 (two) times daily as needed. 60 capsule 6   escitalopram (LEXAPRO) 5 MG tablet Take 5 mg by mouth daily.     FLUoxetine (PROZAC) 20 MG capsule Take 20 mg by mouth as needed.     gabapentin (NEURONTIN) 300 MG capsule Take 1 capsule by mouth at bedtime.     hydrALAZINE (APRESOLINE) 50 MG tablet Take 50 mg by mouth in the morning, at noon, and at bedtime.     ibuprofen (ADVIL) 600 MG tablet Take 1 tablet (600 mg total) by mouth every 6 (six) hours as needed. (Patient not taking: Reported on 09/22/2021) 30 tablet 0   meloxicam (MOBIC) 15 MG tablet Take 1 tablet by mouth daily. (Patient not taking: Reported on 09/22/2021)     Multiple Vitamin (MULTIVITAMIN WITH MINERALS) TABS tablet Take 1 tablet by mouth daily. (Patient not taking: Reported on 09/22/2021)     SUMAtriptan (IMITREX) 50 MG tablet sumatriptan 50 mg tablet  TAKE 1 TAB(S) ORALLY 3 TIMES A DAY, AS NEEDED (Patient not taking: Reported on 09/22/2021)     tiZANidine (ZANAFLEX) 4 MG tablet Take 4 mg by mouth every 8 (eight) hours as needed. (Patient not taking: Reported on 09/22/2021)     No current facility-administered medications for this visit.   Allergies: Allergies  Allergen Reactions   Shellfish Allergy     Hives    Soy Allergy Hives    Soy sauce   Social History: Social History   Socioeconomic History   Marital status: Married    Spouse name: Not on file   Number of children: Not on file   Years of education: Not on file   Highest education level: Not on file  Occupational History   Not on file  Tobacco Use   Smoking  status: Some Days    Types: Cigars   Smokeless tobacco: Never  Vaping Use   Vaping Use: Never used  Substance and Sexual Activity   Alcohol use: Yes    Alcohol/week: 0.0 standard drinks of alcohol    Comment: occassional   Drug use: No   Sexual activity: Yes    Partners: Female    Birth control/protection: None  Other Topics Concern   Not on file  Social History Narrative   Not on file   Social Determinants of Health   Financial Resource Strain: Not on file  Food Insecurity: Not on file  Transportation Needs: Not on file  Physical Activity: Not on file  Stress: Not on file  Social Connections: Not on file   Lives in a ***. Smoking: *** Occupation: ***  Environmental HistorySurveyor, minerals in the house: Copywriter, advertising in the family room: {Blank single:19197::"yes","no"} Carpet in the bedroom: {Blank single:19197::"yes","no"} Heating: {Blank single:19197::"electric","gas","heat pump"} Cooling: {Blank single:19197::"central","window","heat pump"} Pet: {Blank single:19197::"yes ***","no"}  Family History: No family history on file. Problem                               Relation Asthma                                   *** Eczema                                ***  Food allergy                          *** Allergic rhino conjunctivitis     ***  Review of Systems  Constitutional:  Negative for appetite change, chills, fever and unexpected weight change.  HENT:  Negative for congestion and rhinorrhea.   Eyes:  Negative for itching.  Respiratory:  Negative for cough, chest tightness, shortness of breath and wheezing.   Cardiovascular:  Negative for chest pain.  Gastrointestinal:  Negative for abdominal pain.  Genitourinary:  Negative for difficulty urinating.  Skin:  Negative for rash.  Neurological:  Negative for headaches.    Objective: There were no vitals taken for this visit. There is no height or weight on file to calculate  BMI. Physical Exam Vitals and nursing note reviewed.  Constitutional:      Appearance: Normal appearance. He is well-developed.  HENT:     Head: Normocephalic and atraumatic.     Right Ear: Tympanic membrane and external ear normal.     Left Ear: Tympanic membrane and external ear normal.     Nose: Nose normal.     Mouth/Throat:     Mouth: Mucous membranes are moist.     Pharynx: Oropharynx is clear.  Eyes:     Conjunctiva/sclera: Conjunctivae normal.  Cardiovascular:     Rate and Rhythm: Normal rate and regular rhythm.     Heart sounds: Normal heart sounds. No murmur heard.    No friction rub. No gallop.  Pulmonary:     Effort: Pulmonary effort is normal.     Breath sounds: Normal breath sounds. No wheezing, rhonchi or rales.  Musculoskeletal:     Cervical back: Neck supple.  Skin:    General: Skin is warm.     Findings: No rash.  Neurological:     Mental Status: He is alert and oriented to person, place, and time.  Psychiatric:        Behavior: Behavior normal.    The plan was reviewed with the patient/family, and all questions/concerned were addressed.  It was my pleasure to see Antonio Coleman today and participate in his care. Please feel free to contact me with any questions or concerns.  Sincerely,  Wyline Mood, DO Allergy & Immunology  [facility] office: [phone] [facility] office: [phone]

## 2021-10-04 ENCOUNTER — Encounter: Payer: Self-pay | Admitting: Allergy

## 2021-10-04 ENCOUNTER — Ambulatory Visit (INDEPENDENT_AMBULATORY_CARE_PROVIDER_SITE_OTHER): Payer: Commercial Managed Care - PPO | Admitting: Allergy

## 2021-10-04 ENCOUNTER — Other Ambulatory Visit: Payer: Self-pay

## 2021-10-04 VITALS — BP 128/84 | HR 69 | Temp 98.2°F | Resp 18 | Ht 76.0 in | Wt 292.8 lb

## 2021-10-04 DIAGNOSIS — L272 Dermatitis due to ingested food: Secondary | ICD-10-CM

## 2021-10-04 DIAGNOSIS — J3089 Other allergic rhinitis: Secondary | ICD-10-CM

## 2021-10-04 DIAGNOSIS — T781XXD Other adverse food reactions, not elsewhere classified, subsequent encounter: Secondary | ICD-10-CM | POA: Diagnosis not present

## 2021-10-04 MED ORDER — EPINEPHRINE 0.3 MG/0.3ML IJ SOAJ: 0.3000 mg | INTRAMUSCULAR | 1 refills | Status: AC | PRN

## 2021-10-04 MED ORDER — RYALTRIS 665-25 MCG/ACT NA SUSP: 1.0000 | Freq: Two times a day (BID) | NASAL | 5 refills | Status: DC

## 2021-10-04 NOTE — Assessment & Plan Note (Signed)
Perennial rhinitis symptoms with worsening PND and mucous in the throat in the mornings. Tried Flonase and dymista with some benefit. 2017 skin testing showed multiple positives per patient report. Only did few AIT injections due to itching. ENT evaluation showed deviated septum. Follows with pulm for OSA on CPAP. Some reflux symptoms.   Today's skin testing showed: Positive to weed pollen, cockroaches, dust mites.   Start environmental control measures as below.  Use over the counter antihistamines such as Zyrtec (cetirizine), Claritin (loratadine), Allegra (fexofenadine), or Xyzal (levocetirizine) daily as needed. May take twice a day during allergy flares. May switch antihistamines every few months.  Start Ryaltris (olopatadine + mometasone nasal spray combination) 1-2 sprays per nostril twice a day. Sample given.  This replaces your other nasal sprays.  If this works well for you, then have [facility] ship the medication to your home - prescription already sent in.   Nasal saline spray (i.e., Simply Saline) or nasal saline lavage (i.e., NeilMed) is recommended as needed and prior to medicated nasal sprays.  Consider allergy injections for long term control if above medications do not help the symptoms - handout given.   If no improvement, consider PPI trial as well.

## 2021-10-04 NOTE — Patient Instructions (Addendum)
Today's skin testing showed: Positive to weed pollen, cockroaches, dust mites.  Positive to shellfish. Negative to other select foods including soy.   Results given.  Environmental allergies Start environmental control measures as below. Use over the counter antihistamines such as Zyrtec (cetirizine), Claritin (loratadine), Allegra (fexofenadine), or Xyzal (levocetirizine) daily as needed. May take twice a day during allergy flares. May switch antihistamines every few months. Start Ryaltris (olopatadine + mometasone nasal spray combination) 1-2 sprays per nostril twice a day. Sample given. This replaces your other nasal sprays. If this works well for you, then have [facility] ship the medication to your home - prescription already sent in.  Nasal saline spray (i.e., Simply Saline) or nasal saline lavage (i.e., NeilMed) is recommended as needed and prior to medicated nasal sprays. Consider allergy injections for long term control if above medications do not help the symptoms - handout given.   Food Continue to avoid soy sauce and shellfish. I have prescribed epinephrine injectable device and demonstrated proper use. For mild symptoms you can take over the counter antihistamines such as Benadryl and monitor symptoms closely. If symptoms worsen or if you have severe symptoms including breathing issues, throat closure, significant swelling, whole body hives, severe diarrhea and vomiting, lightheadedness then inject epinephrine and seek immediate medical care afterwards. Emergency action plan given.  Follow up in 2 months or sooner if needed.  Our [place] office is moving in September 2023 to a new location. New address: [address] [place], [place], [place] [address] (white building). [place] office: [phone] (same phone number).   Control of House Dust Mite Allergen Dust mite allergens are a common trigger of allergy and asthma symptoms. While they can be found throughout the house, these  microscopic creatures thrive in warm, humid environments such as bedding, upholstered furniture and carpeting. Because so much time is spent in the bedroom, it is essential to reduce mite levels there.  Encase pillows, mattresses, and box springs in special allergen-proof fabric covers or airtight, zippered plastic covers.  Bedding should be washed weekly in hot water (130 F) and dried in a hot dryer. Allergen-proof covers are available for comforters and pillows that can't be regularly washed.  Wash the allergy-proof covers every few months. Minimize clutter in the bedroom. Keep pets out of the bedroom.  Keep humidity less than 50% by using a dehumidifier or air conditioning. You can buy a humidity measuring device called a hygrometer to monitor this.  If possible, replace carpets with hardwood, linoleum, or washable area rugs. If that's not possible, vacuum frequently with a vacuum that has a HEPA filter. Remove all upholstered furniture and non-washable window drapes from the bedroom. Remove all non-washable stuffed toys from the bedroom.  Wash stuffed toys weekly.  Cockroach Allergen Avoidance Cockroaches are often found in the homes of densely populated urban areas, schools or commercial buildings, but these creatures can lurk almost anywhere. This does not mean that you have a dirty house or living area. Block all areas where roaches can enter the home. This includes crevices, wall cracks and windows.  Cockroaches need water to survive, so fix and seal all leaky faucets and pipes. Have an exterminator go through the house when your family and pets are gone to eliminate any remaining roaches. Keep food in lidded containers and put pet food dishes away after your pets are done eating. Vacuum and sweep the floor after meals, and take out garbage and recyclables. Use lidded garbage containers in the kitchen. Wash dishes immediately after  use and clean under stoves, refrigerators or toasters where  crumbs can accumulate. Wipe off the stove and other kitchen surfaces and cupboards regularly.  Reducing Pollen Exposure Pollen seasons: trees (spring), grass (summer) and ragweed/weeds (fall). Keep windows closed in your home and car to lower pollen exposure.  Install air conditioning in the bedroom and throughout the house if possible.  Avoid going out in dry windy days - especially early morning. Pollen counts are highest between 5 - 10 AM and on dry, hot and windy days.  Save outside activities for late afternoon or after a heavy rain, when pollen levels are lower.  Avoid mowing of grass if you have grass pollen allergy. Be aware that pollen can also be transported indoors on people and pets.  Dry your clothes in an automatic dryer rather than hanging them outside where they might collect pollen.  Rinse hair and eyes before bedtime.  Buffered Isotonic Saline Irrigations:  Goal: When you irrigate with the isotonic saline (salt water) it washes mucous and other debris from your nose that could be contributing to your nasal symptoms.   Recipe: Obtain 1 quart jar that is clean Fill with clean (bottled, boiled or distilled) water Add 1-2 heaping teaspoons of salt without iodine If the solution with 2 teaspoons of salt is too strong, adjust the amount down until better tolerated Add 1 teaspoon of Arm [employer] Hammer baking soda (pure bicarbonate) Mix ingredients together and store at room temperature and discard after 1 week * Alternatively you can buy pre made salt packets for the NeilMed bottle or there          are other over the counter brands available  Instructions: Warm  cup of the solution in the microwave if desired but be careful not to overheat as this will burn the inside of your nose Stand over a sink (or do it while you shower) and squirt the solution into one side of your nose aiming towards the back of your head Sometimes saying "coca cola" while irrigating can be helpful to  prevent fluid from going down your throat  The solution will travel to the back of your nose and then come out the other side Perform this again on the other side Try to do this twice a day If you are using a nasal spray in addition to the irrigation, irrigate first and then use the topical nasal spray otherwise you will wash the nasal spray out of your nose

## 2021-10-04 NOTE — Assessment & Plan Note (Signed)
Shellfish caused vomiting. No issues with finned fish. Soy caused facial rash. No prior work up.  Today's skin testing showed: Positive to shellfish. Negative to other select foods including soy.  . Continue to avoid soy sauce and shellfish. . I have prescribed epinephrine injectable device and demonstrated proper use. For mild symptoms you can take over the counter antihistamines such as Benadryl and monitor symptoms closely. If symptoms worsen or if you have severe symptoms including breathing issues, throat closure, significant swelling, whole body hives, severe diarrhea and vomiting, lightheadedness then inject epinephrine and seek immediate medical care afterwards. . Emergency action plan given.

## 2021-10-05 ENCOUNTER — Telehealth: Payer: Self-pay | Admitting: Allergy

## 2021-10-05 MED ORDER — MOMETASONE FUROATE 50 MCG/ACT NA SUSP: 2.0000 | Freq: Every day | NASAL | 5 refills | Status: DC

## 2021-10-05 MED ORDER — AZELASTINE HCL 0.1 % NA SOLN: 1.0000 | Freq: Two times a day (BID) | NASAL | 5 refills | Status: AC | PRN

## 2021-10-05 NOTE — Telephone Encounter (Signed)
Nasonex generic and azelastine are preferred please advise to change in therapy

## 2021-10-05 NOTE — Addendum Note (Signed)
Addended by: Ellamae Sia on: 10/05/2021 11:18 AM   Modules accepted: Orders

## 2021-10-05 NOTE — Telephone Encounter (Signed)
Patient called and said that his ins will not cover Ryaltris need to have something else // [facility] [address] //[phone]

## 2021-10-05 NOTE — Telephone Encounter (Addendum)
Called patient - DOB/Phamacy verified - advised of provider notation below.   Patient verbalized understanding, no further questions.  Prescriptions sent to pharmacy by provider.

## 2021-10-05 NOTE — Telephone Encounter (Signed)
Please call patient and tell him that insurance won't cover Ryaltris.  Use azelastine nasal spray 1-2 sprays per nostril twice a day as needed for runny nose/drainage.  Use Nasonex (mometasone) nasal spray 2 sprays per nostril once a day as needed for nasal congestion.

## 2021-10-17 ENCOUNTER — Other Ambulatory Visit (HOSPITAL_BASED_OUTPATIENT_CLINIC_OR_DEPARTMENT_OTHER): Payer: Commercial Managed Care - PPO

## 2021-12-25 ENCOUNTER — Ambulatory Visit: Payer: Commercial Managed Care - PPO | Admitting: Internal Medicine

## 2023-02-05 NOTE — Progress Notes (Signed)
 [facility] [place] RETURN PATIENT EVALUATION  Primary Care Physician:  Zachary Conger, MD   Patient ID:  Antonio Coleman. is a 43 y.o. (DOB 08/12/1980) male.    Pertinent History:  Patient states that he was in his normal state of health when he was playing golf in [place]  at the end of April 2022.  There was a small accident with a golf cart that resulted in him hitting his head against his arm.  This resulted in a left shoulder injury.  Was not knocked unconscious and did not have any acute headaches until later in the day.  Since that time has had more problems with persistent headaches that are described as holocephalic.  They are also associated with photophobia although this is slightly improved now.  Has also been noticing some disruption to his sleep with some irritability.  Also notes some difficulty with concentration, multitasking and memory.  Denies any other focal motor or sensory deficits.  Has also been having some increased blood pressure as well.  Denies any significant headaches or blood pressure issues prior to the head injury.  Was started on amitriptyline to see if this will help with some of the headaches, sleep problems and other postconcussive symptoms.  Unfortunately after he took the first dose of amitriptyline, had bad nightmares and discontinued the medication altogether.  Continues to feel like he is having frequent headaches that are near constant now.  Feels like the headaches are more prominent and still having agitation and anger issues follow-up.  Has followed up with primary care who had started him on losartan.  However he states that he was concerned about possible interactions with medications and discontinued his other blood pressure medication.  Also saw orthopedist for neck pain and shoulder pain.  Was given baclofen  and Celebrex , but again discontinue these as he was concerned about possible medication interaction.  Continues to have  frequent headaches that are debilitating.  Using bluelight filter sunglasses which does seem to help with some of the light sensitivity.  Also having sound sensitivity.  Seeing sleep specialist and was diagnosed with central sleep apnea.  Was tried on Topamax 25 mg, but only took this for a few days and did not find any relief.  Did not go up to higher doses.  Did not tolerate the Topamax, but had been getting some benefit with amitriptyline.  Notices that the amitriptyline did seem to be slightly reducing the frequency of the headaches and also helping with the sleep.  However he was also noting that when he would take amitriptyline oftentimes he would feel more hung over in the morning.     Subjective   Current Status:  [name] is a 43 y.o. male returns today for paresthesias.  [facility] but notes that he has been having increasing problems with numbness and tingling in both hands as well as both legs.  Notes that a lot of that he paresthesias in the hands are bilateral but has been resulting in hand weakness and dropping things.  Does not seem to notice any particular radiating symptoms, but feels like the symptoms are getting progressively worse.  Also has been noticing more paresthesias in the bilateral lower extremities below the knees, but not really affecting the feet.  Imaging has been ordered through the [facility] of his spine, but has not been done yet.  Referred for evaluation of paresthesias and neurodiagnostic testing.  Past Medical  History, Past Surgery History, Social History, and Family History were reviewed and updated.    History reviewed. No pertinent past medical history.  History reviewed. No pertinent surgical history. History reviewed. No pertinent family history. Social History   Socioeconomic History  . Marital status: Single  Tobacco Use  . Smoking status: Former    Types: Cigars    Start date: 2021    Quit date:  2023    Years since quitting: 2.0    Passive exposure: Past  . 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 Vaping status: Never Used  Substance and Sexual Activity  . Alcohol use: Yes  . Drug use: Never      Current Home Medications   Medication Sig  albuterol sulfate HFA (PROVENTIL,VENTOLIN,PROAIR) 108 (90 Base) MCG/ACT inhaler Inhale two puffs into the lungs every 6 (six) hours as needed for Wheezing.  amitriptyline HCl (ELAVIL) 25 mg tablet Take one half tablet to one tablet (12.5-25 mg dose) by mouth at bedtime.  amlodipine-olmesartan (AZOR) 10-40 MG per tablet Take one tablet by mouth daily.  chlorpheniramine (ALLER-CHLOR,CHLOR-TRIMETON) 4 mg tablet Take one tablet (4 mg dose) by mouth every 6 (six) hours as needed for Allergies.  dicyclomine (BENTYL) 10 mg capsule Take ten mg by mouth 30 (thirty) minutes before meals & at bedtime.  fluticasone -salmeterol (ADVAIR HFA) 115-21 MCG/ACT inhaler Inhale two puffs into the lungs 2 (two) times daily.  hydrALAzine HCl (APRESOLINE) 50 mg tablet Take one tablet (50 mg dose) by mouth 3 (three) times a day.  loperamide (IMODIUM) 2 MG capsule Take one capsule (2 mg dose) by mouth 4 (four) times a day as needed for Diarrhea.  meloxicam (MOBIC) 15 mg tablet Take one tablet (15 mg dose) by mouth daily.  omeprazole (PRILOSEC) 20 mg capsule Take one capsule (20 mg dose) by mouth daily.  sertraline (ZOLOFT) 100 mg tablet Take one tablet (100 mg dose) by mouth daily.  tiZANidine (ZANAFLEX) 4 mg capsule Take one capsule (4 mg dose) by mouth 3 (three) times a day.  traZODone (DESYREL) 100 mg tablet Take one tablet (100 mg dose) by mouth at bedtime.    The patient is allergic to shellfish allergy  and soy allergy .  Review of Systems is complete and negative except as noted in History of Present Illness.  Objective    PHYSICAL EXAM: BP 124/85 (BP Location: Left Upper Arm, Patient Position: Sitting)   Pulse 80   Ht 6' 4 (1.93 m)  Comment: per record  Wt 290 lb 12.8 oz (131.9 kg)   SpO2 96%   BMI 35.40 kg/m  GENERAL:  No acute distress.  SKIN:   Inspection: well perfused, no edema.   MENTAL STATUS EXAM: Orientation: Alert and oriented to person, place and time. Memory: Cooperative, follows commands well. Recent and remote memory normal.. Attention, concentration: Attention span and concentration are normal. Language: Speech is clear and language is normal. Fund of knowledge: Aware of current events, vocabulary appropriate for patient age.   CRANIAL NERVES: CN 2 (Optic): Visual fields intact to confrontation CN 3,4,6 (EOM): Full extraocular eye movement without nystagmus. CN 7 (Facial): No facial weakness or asymmetry. CN 8 (Auditory): Auditory acuity grossly normal. CN 11 (spinal access): Normal sternocleidomastoid and trapezius strength.   MOTOR: Muscle Strength: Strength - 5/5 and symmetric in the upper and lower extremities, no pronation or drift. Muscle Tone: Tone and muscle bulk are normal in the upper and lower extremities.   REFLEXES:  DTRs - 2+ in all four extremities  COORDINATION:   No tremor.   SENSATION:  Intact to light touch, vibration  GAIT: Routine gait is normal.    DIAGNOSTIC STUDIES:   CT Head 06/08/2020 Normal head CT without contrast.  MRI Brain 09/12/2020 Normal  LABORATORY STUDIES:   Lab Results  Component Value Date   Glucose 100 (H) 06/08/2020   RPR Non Reactive 06/10/2014   Total Protein 7.5 06/08/2020   ALBUMIN/GLOBULIN RATIO 1.3 06/08/2020   Please Note: Comment 06/10/2014      Assessment   43 y.o. male with PMHx as above who presents for evaluation of paresthesias.  Unclear how much of this could be more of a peripheral length dependent neuropathy versus a radiculopathy.  Already has spine imaging ordered through the [facility], but we can order an EMG/NCS to see if there is any active evidence of radiculopathy or nerve injury.  Plan   Paresthesias of the upper and  lower extremities Order EMG/NCS  Risks, benefits, and alternatives of the medications and treatment plan prescribed today were discussed, and patient expressed understanding and agreement with the plan.  All new prescription medications and changes in current prescription dosages were discussed with the patient, including patient education, medication name, use, dosage, potential side effects, drug interactions, consequences of not using/taking and special instructions. Patient expressed understanding. No barriers to adherence.  Follow up for Follow up after nerve testing is complete.  Medford Blumenthal, MD 02/05/2023, 4:35 PM  *This note was dictated with voice recognition software. Inadvertently, similar sounding words can, sometimes, get transcribed incorrectly

## 2023-07-11 ENCOUNTER — Encounter: Payer: Self-pay | Admitting: Physical Medicine & Rehabilitation

## 2023-08-01 ENCOUNTER — Encounter: Payer: Self-pay | Admitting: Physical Medicine & Rehabilitation

## 2023-08-01 ENCOUNTER — Telehealth: Payer: Self-pay | Admitting: Physical Medicine & Rehabilitation

## 2023-08-01 ENCOUNTER — Encounter: Attending: Physical Medicine & Rehabilitation | Admitting: Physical Medicine & Rehabilitation

## 2023-08-01 VITALS — BP 122/84 | HR 65 | Ht 76.0 in | Wt 281.0 lb

## 2023-08-01 DIAGNOSIS — R200 Anesthesia of skin: Secondary | ICD-10-CM | POA: Diagnosis present

## 2023-08-01 DIAGNOSIS — M545 Low back pain, unspecified: Secondary | ICD-10-CM | POA: Diagnosis not present

## 2023-08-01 DIAGNOSIS — M47816 Spondylosis without myelopathy or radiculopathy, lumbar region: Secondary | ICD-10-CM | POA: Diagnosis present

## 2023-08-01 NOTE — Telephone Encounter (Signed)
 Patient was called to scheduled for Bilateral MBB L3-4-5  - patient would like to talk about procedure with someone to make sure he is understanding what to expect for procedure and also we would need to go over pre - procedure with him . Patient had another appointment and asked for a call back next week

## 2023-08-01 NOTE — Progress Notes (Signed)
 Subjective:    Patient ID: Antonio Coleman., male    DOB: 1980-06-19, 43 y.o.   MRN: [id number]  HPI  CC:  Low back pain with LE numbness 43 year old male referred by the [facility] for evaluation low back pain as well as lower extremity numbness.  Back pain symptoms started around 10 years ago.   has been prescribed Tizanidine ( uses every day)  and has used aspirin (up to twice a week). The tizanidine basically gets rid of his pain but he sleeps for about 5 or 6 hours after he takes this medication. He is limited functionally in terms of dressing his shoes and socks he has difficulty bending forward. He is able to ambulate without assistive device He has not worked for about 3 years.  Prior to that he was working in a fairly sedentary role.  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  Sitting for >5 min causes numbness and tingling in ant shin area which improves when he stands up after 30 seconds or so.  Now having more frequent pain in the low back area Back Spasms come and go 3-4 times a week  Does not have DM No back surgery  No back injection  MRI lumbar spine: Performed less than 1 month ago and was reviewed.  INDICATION: Back pain.  TECHNIQUE: Sagittal and axial T1 and T2-weighted sequences were performed. Additional sagittal STIR images were performed.  COMPARISON: None available at time of dictation.  FINDINGS: There are 5 nonrib-bearing lumbar-type vertebrae. The conus medullaris terminates at a normal level. Trace grade 1 retrolisthesis of L5 on S1. Trace L5-S1 intervertebral disc height loss. The vertebral body heights are maintained. Mild rightward apex curvature of the lumbar spine. No suspicious marrow signal.  L1-2: No significant spinal canal or neural foraminal stenosis. L2-3: No significant spinal canal or neural foraminal stenosis. L3-4: No significant spinal canal or neural foraminal  stenosis. L4-5: No significant spinal canal or neural foraminal stenosis. L5-S1: Small disc bulge. Mild left neural foraminal stenosis. No significant spinal canal stenosis.   Elester Feeling, MD - 07/23/2023 Formatting of this note might be different from the original. MRI lumbar spine:  INDICATION: Back pain.  TECHNIQUE: Sagittal and axial T1 and T2-weighted sequences were performed. Additional sagittal STIR images were performed.  COMPARISON: None available at time of dictation.  FINDINGS: There are 5 nonrib-bearing lumbar-type vertebrae. The conus medullaris terminates at a normal level. Trace grade 1 retrolisthesis of L5 on S1. Trace L5-S1 intervertebral disc height loss. The vertebral body heights are maintained. Mild rightward apex curvature of the lumbar spine. No suspicious marrow signal.  L1-2: No significant spinal canal or neural foraminal stenosis. L2-3: No significant spinal canal or neural foraminal stenosis. L3-4: No significant spinal canal or neural foraminal stenosis. L4-5: No significant spinal canal or neural foraminal stenosis. L5-S1: Small disc bulge. Mild left neural foraminal stenosis. No significant spinal canal stenosis.   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  Electronically Signed by: Feeling Elester, MD on 07/23/2023 3:32 PM  Pain Inventory Average Pain 10 Pain Right Now 10 My pain is sharp, tingling, and aching  In the last 24 hours, has pain interfered with the following? General activity 10 Relation with others 10 Enjoyment of life 10 What TIME of day is your pain at its worst? morning , daytime, evening, and night Sleep (in general) Poor  Pain is worse with: walking, bending, sitting, and standing Pain improves with: heat/ice and TENS Relief from Meds: 2  walk with assistance how many minutes can you walk? 15 ability to climb steps?  yes do you drive?  yes  not employed: date last  employed . I need assistance with the following:  dressing and bathing  weakness numbness tingling trouble walking spasms dizziness depression anxiety  New pt  New pt    No family history on file. Social History   Socioeconomic History   Marital status: Married    Spouse name: Not on file   Number of children: Not on file   Years of education: Not on file   Highest education level: Not on file  Occupational History   Not on file  Tobacco Use   Smoking status: Some Days    Types: Cigars   Smokeless tobacco: Never  Vaping Use   Vaping status: Never Used  Substance and Sexual Activity   Alcohol use: Yes    Alcohol/week: 0.0 standard drinks of alcohol    Comment: occassional   Drug use: No   Sexual activity: Yes    Partners: Female    Birth control/protection: None  Other Topics Concern   Not on file  Social History Narrative   Not on file   Social Drivers of Health   Financial Resource Strain: Low Risk  (02/05/2023)   Received from [facility]   Overall Financial Resource Strain (CARDIA)    Difficulty of Paying Living Expenses: Not hard at all  Food Insecurity: No Food Insecurity (02/05/2023)   Received from [facility]   Hunger Vital Sign    Within the past 12 months, you worried that your food would run out before you got the money to buy more.: Never true    Within the past 12 months, the food you bought just didn't last and you didn't have money to get more.: Never true  Transportation Needs: No Transportation Needs (02/05/2023)   [facility] - Transportation    Lack of Transportation (Medical): No    Lack of Transportation (Non-Medical): No  Physical Activity: Inactive (11/22/2020)   Received from [facility]   Exercise Vital Sign    On average, how many days per week do you engage in moderate to strenuous exercise (like a brisk walk)?: 0 days    On average, how many minutes do you engage in exercise at this level?: 0 min   Stress: Stress Concern Present (11/22/2020)   Received from [facility] - Occupational Stress Questionnaire    Feeling of Stress : Very much  Social Connections: Unknown (05/29/2021)   Received from [facility]   Social Network    Social Network: Not on file   Past Surgical History:  Procedure Laterality Date   ROTATOR CUFF REPAIR Left    WISDOM TOOTH EXTRACTION     Past Medical History:  Diagnosis Date   Allergy     Headache    BP 122/84   Pulse 65   Ht 6' 4 (1.93 m)   Wt 281 lb (127.5 kg)   SpO2 99%   BMI 34.20 kg/m   Opioid Risk Score:   Fall Risk Score:  `1  Depression screen [facility] 2/9     08/01/2023    2:51 PM  Depression screen PHQ 2/9  Decreased Interest 3  Down, Depressed, Hopeless 3  PHQ - 2 Score 6  Altered sleeping 3  Tired, decreased energy 3  Change in appetite 0  Feeling bad or failure about yourself  3  Trouble concentrating 2  Moving slowly or fidgety/restless 0  Suicidal thoughts 3  PHQ-9 Score 20     Review of Systems  Respiratory:  Positive for apnea and shortness of breath.   Gastrointestinal:  Positive for constipation and diarrhea.  Musculoskeletal:  Positive for back pain and gait problem.       B/L front of leg pain spasms  Neurological:  Positive for weakness and numbness.  Psychiatric/Behavioral:  Positive for dysphoric mood.   All other systems reviewed and are negative.      Objective:   Physical Exam Constitutional:      Appearance: He is obese.  HENT:     Head: Normocephalic and atraumatic.  Neurological:     Mental Status: He is alert and oriented to person, place, and time.     Cranial Nerves: No dysarthria or facial asymmetry.     Motor: Motor function is intact. No abnormal muscle tone.     Coordination: Coordination is intact.     Gait: Gait is intact.     Deep Tendon Reflexes:     Reflex Scores:      Patellar reflexes are 2+ on the right side and 2+ on the left  side.      Achilles reflexes are 2+ on the right side and 2+ on the left side.    Comments: Negative straight leg raising bilaterally Sensation intact to pinprick and light touch bilateral L2-L3-L4 L5-S1 dermatomal distributions  Psychiatric:        Mood and Affect: Mood normal.        Behavior: Behavior normal.   Lumbar spine has tenderness palpation lumbar paraspinals starting at L4-S1.  He has pain with extension greater than with flexion lateral bending is also more painful than flexion. Lower extremities have no evidence of atrophy.  Sacral thrust (prone) : Positive left Lateral compression: Negative FABER's: Positive left Distraction (supine): Negative Thigh thrust test: Positive left        Assessment & Plan:  1.  Chronic low back pain likely multifactorial.  His lumbar MRI is unremarkable.  No evidence of nerve root impingement. He does have a small disc bulge at L5-S1 which is not unusual over the age of 43.  He does have some symptoms that correlate such as pain with bending forward to put on his shoes His exam is most consistent with lumbar spondylosis as a potential pain generator.  Recommend bilateral L3-L4 medial branch L5 dorsal ramus injection under fluoroscopic guidance as a diagnostic procedure.  We discussed the purpose of this as well as potential benefit from a radiofrequency neurotomy of the same nerves.  Patient's wife was in the room for the visit 2.  Bilateral lower extremity pain and upon further questioning appears to be mainly numbness tingling below the knees with prolonged sitting in 1 position which resolves with standing.  I suspect that he has sciatic nerve compression at the ischial tuberosity.  Will check EMG/NCV of the lower extremities.  I do not think this is explained by his lumbar MRI findings

## 2023-08-09 ENCOUNTER — Telehealth: Payer: Self-pay | Admitting: Physical Medicine & Rehabilitation

## 2023-08-09 NOTE — Telephone Encounter (Signed)
 P called and wants authorization for emg sent to [facility] in [place]. He would rather have it done there since they have earlier appts in the day  Fax: [phone] Attn: cassidy

## 2023-08-16 ENCOUNTER — Encounter: Admitting: Physical Medicine & Rehabilitation

## 2023-10-03 ENCOUNTER — Encounter: Attending: Physical Medicine & Rehabilitation | Admitting: Physical Medicine & Rehabilitation

## 2023-10-03 DIAGNOSIS — R200 Anesthesia of skin: Secondary | ICD-10-CM | POA: Insufficient documentation

## 2023-10-03 DIAGNOSIS — M47816 Spondylosis without myelopathy or radiculopathy, lumbar region: Secondary | ICD-10-CM | POA: Insufficient documentation

## 2023-10-03 DIAGNOSIS — M545 Low back pain, unspecified: Secondary | ICD-10-CM | POA: Insufficient documentation

## 2023-10-28 ENCOUNTER — Encounter (INDEPENDENT_AMBULATORY_CARE_PROVIDER_SITE_OTHER): Payer: Self-pay

## 2023-10-28 ENCOUNTER — Ambulatory Visit (INDEPENDENT_AMBULATORY_CARE_PROVIDER_SITE_OTHER)

## 2023-10-28 VITALS — BP 133/85 | HR 70 | Temp 98.5°F | Ht 76.0 in | Wt 280.0 lb

## 2023-10-28 DIAGNOSIS — G4733 Obstructive sleep apnea (adult) (pediatric): Secondary | ICD-10-CM | POA: Diagnosis not present

## 2023-10-28 DIAGNOSIS — J3489 Other specified disorders of nose and nasal sinuses: Secondary | ICD-10-CM

## 2023-10-28 DIAGNOSIS — J329 Chronic sinusitis, unspecified: Secondary | ICD-10-CM

## 2023-10-28 DIAGNOSIS — R040 Epistaxis: Secondary | ICD-10-CM

## 2023-10-28 MED ORDER — AZELASTINE-FLUTICASONE 137-50 MCG/ACT NA SUSP: 1.0000 | Freq: Two times a day (BID) | NASAL | 2 refills | Status: DC

## 2023-10-28 NOTE — Progress Notes (Signed)
 HPI:   Antonio Vernet. is a 43 y.o. male who presents as a new patient regarding consultation for chronic nasal congestion. Patient states that he has had difficulty with nasal congestion since 2008. Patient states that it started with epistaxis from the left side which became more frequent and then became bilateral. This was always controlled with conservative measures. He states that it then progressed to nasal congestion and drainage which was primarily post-nasal drainage. He states he has tried several medications including flonase, azelastine , nasal irrigations, afrin, and oral medications for sinus and headache with inconsistent relief. He states that he has facial pain and pressure in his cheeks and between his eyes into his forehead. He states he does have some pain at his TMJ when he wakes up in the morning. He also has diagnosed sleep apnea for which he has been prescribed a CPAP, however due to the discomfort caused by his constant PND and nasal congestion he is unable to tolerate the CPAP at this time. His wife states that he does snore at night and will wake up with significant drainage that he has to clear.     PMH/Meds/All/SocHx/FamHx/ROS: Past Medical History:  Diagnosis Date   Allergy     Headache    Past Surgical History:  Procedure Laterality Date   ROTATOR CUFF REPAIR Left    WISDOM TOOTH EXTRACTION     No family history of bleeding disorders, wound healing problems or difficulty with anesthesia.  Social Connections: Unknown (05/29/2021)   Received from [facility]   Social Network    Social Network: Not on file    Current Outpatient Medications:    albuterol (VENTOLIN HFA) 108 (90 Base) MCG/ACT inhaler, Inhale 2 puffs into the lungs every 4 (four) hours as needed., Disp: , Rfl:    Albuterol Sulfate (PROAIR RESPICLICK) 108 (90 Base) MCG/ACT AEPB, , Disp: , Rfl:    ALPRAZolam (XANAX) 0.25 MG tablet, Take 0.25 mg by mouth daily., Disp: , Rfl:    amitriptyline  (ELAVIL) 25 MG tablet, Take 25 mg by mouth at bedtime., Disp: , Rfl:    amLODipine (NORVASC) 10 MG tablet, Take 1 tablet by mouth daily., Disp: , Rfl:    amLODipine-olmesartan (AZOR) 10-40 MG tablet, Take 1 tablet by mouth daily., Disp: , Rfl:    azelastine  (ASTELIN ) 0.1 % nasal spray, Place 1-2 sprays into both nostrils 2 (two) times daily as needed (nasal drainage). Use in each nostril as directed, Disp: 30 mL, Rfl: 5   baclofen  (LIORESAL ) 10 MG tablet, Take 0.5-1 tablets (5-10 mg total) by mouth 3 (three) times daily as needed for muscle spasms., Disp: 30 each, Rfl: 3   Bioflavonoid Products (VITAMIN C) CHEW, Chew 1 tablet by mouth daily., Disp: , Rfl:    celecoxib  (CELEBREX ) 200 MG capsule, Take 1 capsule (200 mg total) by mouth 2 (two) times daily as needed., Disp: 60 capsule, Rfl: 6   dicyclomine (BENTYL) 10 MG capsule, Take 20 mg by mouth., Disp: , Rfl:    EPINEPHrine  0.3 mg/0.3 mL IJ SOAJ injection, Inject 0.3 mg into the muscle as needed for anaphylaxis. (Patient not taking: Reported on 08/01/2023), Disp: 2 each, Rfl: 1   escitalopram (LEXAPRO) 5 MG tablet, Take 5 mg by mouth daily., Disp: , Rfl:    FLUoxetine (PROZAC) 20 MG capsule, Take 20 mg by mouth as needed., Disp: , Rfl:    fluticasone (FLONASE) 50 MCG/ACT nasal spray, Place 1 spray into the nose., Disp: , Rfl:  fluticasone-salmeterol (ADVAIR HFA) 115-21 MCG/ACT inhaler, Take by mouth., Disp: , Rfl:    gabapentin (NEURONTIN) 300 MG capsule, Take 1 capsule by mouth at bedtime., Disp: , Rfl:    hydrALAZINE (APRESOLINE) 50 MG tablet, Take 50 mg by mouth in the morning, at noon, and at bedtime., Disp: , Rfl:    hydrOXYzine (ATARAX) 10 MG tablet, Take 10 mg by mouth., Disp: , Rfl:    ibuprofen  (ADVIL ) 600 MG tablet, Take 1 tablet (600 mg total) by mouth every 6 (six) hours as needed., Disp: 30 tablet, Rfl: 0   lisinopril (ZESTRIL) 10 MG tablet, Take 1 tablet by mouth daily., Disp: , Rfl:    loperamide (IMODIUM) 2 MG capsule, Take 4 mg by  mouth., Disp: , Rfl:    loperamide (IMODIUM) 2 MG capsule, Take by mouth as needed for diarrhea or loose stools., Disp: , Rfl:    meloxicam (MOBIC) 15 MG tablet, Take 1 tablet by mouth daily., Disp: , Rfl:    mometasone  (NASONEX ) 50 MCG/ACT nasal spray, Place 2 sprays into the nose daily. For nasal congestion., Disp: 1 each, Rfl: 5   montelukast (SINGULAIR) 10 MG tablet, Take 10 mg by mouth., Disp: , Rfl:    Multiple Vitamin (MULTIVITAMIN WITH MINERALS) TABS tablet, Take 1 tablet by mouth daily., Disp: , Rfl:    naproxen (NAPROSYN) 500 MG tablet, Take 500 mg by mouth 2 (two) times daily., Disp: , Rfl:    omeprazole (PRILOSEC) 40 MG capsule, Take 40 mg by mouth., Disp: , Rfl:    sertraline (ZOLOFT) 100 MG tablet, Take 150 mg by mouth., Disp: , Rfl:    SUMAtriptan (IMITREX) 50 MG tablet, , Disp: , Rfl:    tamsulosin (FLOMAX) 0.4 MG CAPS capsule, Take 0.4 mg by mouth., Disp: , Rfl:    tiZANidine (ZANAFLEX) 4 MG tablet, Take 4 mg by mouth every 8 (eight) hours as needed., Disp: , Rfl:    topiramate (TOPAMAX) 50 MG tablet, Take 50 mg by mouth., Disp: , Rfl:    traZODone (DESYREL) 100 MG tablet, Take 1 tablet by mouth at bedtime as needed., Disp: , Rfl:  A complete ROS was performed with pertinent positives/negatives noted in the HPI. The remainder of the ROS are negative.   Physical Exam:  BP 133/85 (BP Location: Right Arm)   Pulse 70   Temp 98.5 F (36.9 C)   Ht 6' 4 (1.93 m)   Wt 280 lb (127 kg)   SpO2 97%   BMI 34.08 kg/m  General: Well developed, well nourished. No acute distress. Voice hyponasal Head/Face: Normocephalic. +sinus tenderness - maxillary and frontal. Facial nerve intact and equal bilaterally. No facial lacerations. Eyes: PERRL, no scleral icterus or conjunctival hemorrhage. EOMI. Ears: No gross deformity. Normal external canal. Tympanic membrane in tact bilaterally Hearing: Normal speech reception.  Nose: No gross deformity or lesions. No purulent discharge. Bilateral  inferior turbinate hypertrophy. Left nasal septal deviation  Mouth/Oropharynx: Lips without any lesions. Dentition good. No mucosal lesions within the oropharynx. Bilateral 3+ tonsillar enlargement with crypts, no exudate, or lesions. Pharyngeal walls symmetrical. Uvula midline. Tongue midline without lesions. Neck: Trachea midline. No masses. No thyromegaly or nodules palpated. No crepitus. Lymphatic: No lymphadenopathy in the neck. Respiratory: No stridor or distress. Room air. Cardiovascular: Regular rate and rhythm. Extremities: No edema or cyanosis. Warm and well-perfused. Skin: No scars or lesions on face or neck. Neurologic: Moving all extremities without gross abnormality. Other:   Independent Review of Additional Tests or Records: None-  patient underwent CT sinus at external hospital and will provide copy at a future date  Procedures: Bilateral Diagnostic Rigid Nasal Endoscopy  Pre-procedure diagnosis: Concern for chronic sinusitis  Post-procedure diagnosis: same Indication: See pre-procedure diagnosis and physical exam above Complications: None apparent Anesthesia: Lidocaine 4% and topical decongestant was topically sprayed in each nasal cavity  Description of Procedure:  Patient was identified. A rigid 30 degree endoscope was utilized to evaluate the sinonasal cavities, mucosa, sinus ostia and turbinates and septum.  Overall, signs of mucosal inflammation are noted.  Also noted is a significant left septal deviation.  No mucopurulence, polyps, or masses noted.   Right Middle meatus: clear mucoid drainage Right SE Recess: no drainage noted Left MM: clear mucoid drainage Left SE Recess: unable to visualize 2/2 septal deviation  CPT CODE -- 68768 - Mod 25    Impression & Plans: Antonio Coleman. is a 43 y.o. male with chronic nasal congestion, post-nasal drainage, and facial pain and pressure consistent with chronic sinusitis.   Chronic sinusitis - Nasal endoscopy  performed showing mucosal inflammation and mucoid drainage - see procedure note for details - Start nasal sinus rinse daily  - Start Dymista 1 spray each nostril twice daily    Chronic nasal obstruction - nasal septal deviation noted on nasal endoscopy - see procedure note for details  OSA - continue CPAP as tolerated  Follow-up in 4-6 weeks for re-evaluation and discussion of CT results. Will discuss surgical options if medical management unable to relieve symptoms.   Adah Malkin, DO [facility]

## 2023-10-28 NOTE — Progress Notes (Signed)
 Pharmacy asked to please put a note in as urgent so they can fill quickly

## 2023-10-30 MED ORDER — FLUTICASONE PROPIONATE 50 MCG/ACT NA SUSP: 2.0000 | Freq: Every day | NASAL | 6 refills | Status: AC

## 2023-10-30 NOTE — Addendum Note (Signed)
 Addended by: MILA LAO on: 10/30/2023 11:08 AM   Modules accepted: Orders

## 2023-10-31 MED ORDER — AZELASTINE HCL 0.1 % NA SOLN: 1.0000 | Freq: Two times a day (BID) | NASAL | 12 refills | Status: AC

## 2023-10-31 NOTE — Addendum Note (Signed)
 Addended by: MILA LAO on: 10/31/2023 11:08 AM   Modules accepted: Orders

## 2023-12-02 ENCOUNTER — Encounter (INDEPENDENT_AMBULATORY_CARE_PROVIDER_SITE_OTHER): Payer: Self-pay

## 2023-12-02 ENCOUNTER — Ambulatory Visit (INDEPENDENT_AMBULATORY_CARE_PROVIDER_SITE_OTHER)
